# Patient Record
Sex: Female | Born: 1935 | Race: White | Hispanic: No | Smoking: Never smoker
Health system: Southern US, Community
[De-identification: ages and names within clinical notes are randomized; demographics above are authoritative.]

## PROBLEM LIST (undated history)

## (undated) DIAGNOSIS — F419 Anxiety disorder, unspecified: Secondary | ICD-10-CM

## (undated) DIAGNOSIS — E119 Type 2 diabetes mellitus without complications: Secondary | ICD-10-CM

## (undated) DIAGNOSIS — F0280 Dementia in other diseases classified elsewhere without behavioral disturbance: Secondary | ICD-10-CM

## (undated) DIAGNOSIS — G309 Alzheimer's disease, unspecified: Secondary | ICD-10-CM

## (undated) DIAGNOSIS — I639 Cerebral infarction, unspecified: Secondary | ICD-10-CM

## (undated) DIAGNOSIS — I10 Essential (primary) hypertension: Secondary | ICD-10-CM

## (undated) DIAGNOSIS — K219 Gastro-esophageal reflux disease without esophagitis: Secondary | ICD-10-CM

## (undated) DIAGNOSIS — R42 Dizziness and giddiness: Secondary | ICD-10-CM

## (undated) HISTORY — DX: Type 2 diabetes mellitus without complications: E11.9

## (undated) HISTORY — DX: Gastro-esophageal reflux disease without esophagitis: K21.9

## (undated) HISTORY — DX: Cerebral infarction, unspecified: I63.9

## (undated) HISTORY — DX: Essential (primary) hypertension: I10

## (undated) HISTORY — PX: ABDOMINAL HYSTERECTOMY: SHX81

## (undated) HISTORY — DX: Dizziness and giddiness: R42

---

## 2007-03-28 ENCOUNTER — Ambulatory Visit: Payer: Self-pay | Admitting: Gastroenterology

## 2007-04-24 ENCOUNTER — Ambulatory Visit: Payer: Self-pay | Admitting: Gastroenterology

## 2007-05-08 ENCOUNTER — Ambulatory Visit: Payer: Self-pay | Admitting: Gastroenterology

## 2007-05-08 ENCOUNTER — Encounter: Payer: Self-pay | Admitting: Gastroenterology

## 2007-12-29 ENCOUNTER — Inpatient Hospital Stay (HOSPITAL_COMMUNITY): Admission: EM | Admit: 2007-12-29 | Discharge: 2008-01-01 | Payer: Self-pay | Admitting: Emergency Medicine

## 2007-12-29 ENCOUNTER — Ambulatory Visit: Payer: Self-pay | Admitting: Cardiology

## 2007-12-31 ENCOUNTER — Encounter (INDEPENDENT_AMBULATORY_CARE_PROVIDER_SITE_OTHER): Payer: Self-pay | Admitting: Emergency Medicine

## 2008-01-15 ENCOUNTER — Ambulatory Visit: Payer: Self-pay | Admitting: Cardiology

## 2008-12-15 ENCOUNTER — Inpatient Hospital Stay (HOSPITAL_COMMUNITY): Admission: EM | Admit: 2008-12-15 | Discharge: 2008-12-17 | Payer: Self-pay | Admitting: Emergency Medicine

## 2008-12-15 ENCOUNTER — Ambulatory Visit: Payer: Self-pay | Admitting: Cardiovascular Disease

## 2008-12-16 ENCOUNTER — Encounter (INDEPENDENT_AMBULATORY_CARE_PROVIDER_SITE_OTHER): Payer: Self-pay | Admitting: Neurology

## 2008-12-17 ENCOUNTER — Encounter (INDEPENDENT_AMBULATORY_CARE_PROVIDER_SITE_OTHER): Payer: Self-pay | Admitting: Neurology

## 2009-11-11 ENCOUNTER — Inpatient Hospital Stay (HOSPITAL_COMMUNITY): Admission: EM | Admit: 2009-11-11 | Discharge: 2009-11-14 | Payer: Self-pay | Admitting: Emergency Medicine

## 2011-02-28 LAB — RETICULOCYTES
RBC.: 3.76 MIL/uL — ABNORMAL LOW (ref 3.87–5.11)
Retic Count, Absolute: 37.6 10*3/uL (ref 19.0–186.0)

## 2011-02-28 LAB — CBC
HCT: 34.5 % — ABNORMAL LOW (ref 36.0–46.0)
HCT: 34.7 % — ABNORMAL LOW (ref 36.0–46.0)
Hemoglobin: 11.3 g/dL — ABNORMAL LOW (ref 12.0–15.0)
MCHC: 32.8 g/dL (ref 30.0–36.0)
Platelets: 180 10*3/uL (ref 150–400)
Platelets: 181 10*3/uL (ref 150–400)
RBC: 3.68 MIL/uL — ABNORMAL LOW (ref 3.87–5.11)
RDW: 14.8 % (ref 11.5–15.5)
RDW: 14.9 % (ref 11.5–15.5)
RDW: 15 % (ref 11.5–15.5)

## 2011-02-28 LAB — BRAIN NATRIURETIC PEPTIDE: Pro B Natriuretic peptide (BNP): 561 pg/mL — ABNORMAL HIGH (ref 0.0–100.0)

## 2011-02-28 LAB — COMPREHENSIVE METABOLIC PANEL
ALT: 11 U/L (ref 0–35)
Albumin: 2.7 g/dL — ABNORMAL LOW (ref 3.5–5.2)
Alkaline Phosphatase: 44 U/L (ref 39–117)
Chloride: 108 mEq/L (ref 96–112)
Potassium: 3.3 mEq/L — ABNORMAL LOW (ref 3.5–5.1)
Sodium: 139 mEq/L (ref 135–145)
Total Bilirubin: 0.4 mg/dL (ref 0.3–1.2)
Total Protein: 6 g/dL (ref 6.0–8.3)

## 2011-02-28 LAB — BASIC METABOLIC PANEL
BUN: 21 mg/dL (ref 6–23)
BUN: 22 mg/dL (ref 6–23)
Calcium: 8.7 mg/dL (ref 8.4–10.5)
Creatinine, Ser: 1.52 mg/dL — ABNORMAL HIGH (ref 0.4–1.2)
GFR calc non Af Amer: 26 mL/min — ABNORMAL LOW (ref >60)
GFR calc non Af Amer: 32 mL/min — ABNORMAL LOW (ref >60)
Glucose, Bld: 113 mg/dL — ABNORMAL HIGH (ref 70–99)
Glucose, Bld: 98 mg/dL (ref 70–99)
Potassium: 3.4 mEq/L — ABNORMAL LOW (ref 3.5–5.1)

## 2011-02-28 LAB — GLUCOSE, CAPILLARY
Glucose-Capillary: 103 mg/dL — ABNORMAL HIGH (ref 70–99)
Glucose-Capillary: 108 mg/dL — ABNORMAL HIGH (ref 70–99)
Glucose-Capillary: 121 mg/dL — ABNORMAL HIGH (ref 70–99)
Glucose-Capillary: 136 mg/dL — ABNORMAL HIGH (ref 70–99)
Glucose-Capillary: 145 mg/dL — ABNORMAL HIGH (ref 70–99)
Glucose-Capillary: 98 mg/dL (ref 70–99)

## 2011-02-28 LAB — RPR: RPR Ser Ql: NONREACTIVE

## 2011-02-28 LAB — BLOOD GAS, ARTERIAL
Acid-Base Excess: 1.1 mmol/L (ref 0.0–2.0)
FIO2: 0.21 %
O2 Saturation: 97.5 %
pCO2 arterial: 32 mmHg — ABNORMAL LOW (ref 35.0–45.0)

## 2011-02-28 LAB — LIPID PANEL
Cholesterol: 141 mg/dL (ref 0–200)
HDL: 26 mg/dL — ABNORMAL LOW (ref >39)
LDL Cholesterol: 98 mg/dL (ref 0–99)
Total CHOL/HDL Ratio: 5.4 RATIO

## 2011-02-28 LAB — DIFFERENTIAL
Basophils Absolute: 0 10*3/uL (ref 0.0–0.1)
Lymphocytes Relative: 33 % (ref 12–46)
Neutro Abs: 4.6 10*3/uL (ref 1.7–7.7)
Neutrophils Relative %: 55 % (ref 43–77)

## 2011-02-28 LAB — CK TOTAL AND CKMB (NOT AT ARMC)
CK, MB: 1.7 ng/mL (ref 0.3–4.0)
Total CK: 31 U/L (ref 7–177)

## 2011-02-28 LAB — IRON AND TIBC
Iron: 40 ug/dL — ABNORMAL LOW (ref 42–135)
TIBC: 230 ug/dL — ABNORMAL LOW (ref 250–470)

## 2011-02-28 LAB — AMMONIA: Ammonia: 24 umol/L (ref 11–35)

## 2011-02-28 LAB — VITAMIN B12: Vitamin B-12: 710 pg/mL (ref 211–911)

## 2011-03-01 LAB — DIFFERENTIAL
Basophils Relative: 0 % (ref 0–1)
Eosinophils Absolute: 0.1 10*3/uL (ref 0.0–0.7)
Lymphs Abs: 1.3 10*3/uL (ref 0.7–4.0)
Monocytes Absolute: 0.6 10*3/uL (ref 0.1–1.0)
Monocytes Relative: 6 % (ref 3–12)
Neutrophils Relative %: 78 % — ABNORMAL HIGH (ref 43–77)

## 2011-03-01 LAB — URINALYSIS, ROUTINE W REFLEX MICROSCOPIC
Hgb urine dipstick: NEGATIVE
Leukocytes, UA: NEGATIVE
Nitrite: NEGATIVE
Protein, ur: 30 mg/dL — AB
Urobilinogen, UA: 0.2 mg/dL (ref 0.0–1.0)

## 2011-03-01 LAB — CBC
Hemoglobin: 12.3 g/dL (ref 12.0–15.0)
MCHC: 32.6 g/dL (ref 30.0–36.0)
Platelets: 193 10*3/uL (ref 150–400)
RDW: 14.8 % (ref 11.5–15.5)

## 2011-03-01 LAB — COMPREHENSIVE METABOLIC PANEL
ALT: 13 U/L (ref 0–35)
AST: 20 U/L (ref 0–37)
Albumin: 3.1 g/dL — ABNORMAL LOW (ref 3.5–5.2)
Alkaline Phosphatase: 59 U/L (ref 39–117)
Calcium: 9.1 mg/dL (ref 8.4–10.5)
GFR calc Af Amer: 33 mL/min — ABNORMAL LOW (ref >60)
Glucose, Bld: 188 mg/dL — ABNORMAL HIGH (ref 70–99)
Potassium: 4.8 mEq/L (ref 3.5–5.1)
Sodium: 136 mEq/L (ref 135–145)
Total Protein: 6.7 g/dL (ref 6.0–8.3)

## 2011-03-01 LAB — POCT CARDIAC MARKERS
CKMB, poc: <1 ng/mL — ABNORMAL LOW (ref 1.0–8.0)
Troponin i, poc: <0.05 ng/mL (ref 0.00–0.09)

## 2011-03-01 LAB — HEMOGLOBIN A1C: Hgb A1c MFr Bld: 6.1 % (ref 4.6–6.1)

## 2011-03-01 LAB — URINE MICROSCOPIC-ADD ON

## 2011-03-01 LAB — CK TOTAL AND CKMB (NOT AT ARMC)
CK, MB: 0.8 ng/mL (ref 0.3–4.0)
Relative Index: INVALID (ref 0.0–2.5)
Total CK: 30 U/L (ref 7–177)

## 2011-03-01 LAB — GLUCOSE, CAPILLARY: Glucose-Capillary: 157 mg/dL — ABNORMAL HIGH (ref 70–99)

## 2011-03-01 LAB — T4, FREE: Free T4: 1.23 ng/dL (ref 0.80–1.80)

## 2011-03-14 LAB — COMPREHENSIVE METABOLIC PANEL
AST: 29 U/L (ref 0–37)
Albumin: 3.2 g/dL — ABNORMAL LOW (ref 3.5–5.2)
Alkaline Phosphatase: 53 U/L (ref 39–117)
BUN: 16 mg/dL (ref 6–23)
CO2: 26 mEq/L (ref 19–32)
Calcium: 9.4 mg/dL (ref 8.4–10.5)
Chloride: 100 mEq/L (ref 96–112)
Chloride: 99 mEq/L (ref 96–112)
Creatinine, Ser: 1.01 mg/dL (ref 0.4–1.2)
Creatinine, Ser: 1.16 mg/dL (ref 0.4–1.2)
GFR calc Af Amer: 53 mL/min — ABNORMAL LOW (ref >60)
GFR calc non Af Amer: 52 mL/min — ABNORMAL LOW (ref >60)
Glucose, Bld: 118 mg/dL — ABNORMAL HIGH (ref 70–99)
Potassium: 3.9 mEq/L (ref 3.5–5.1)
Total Bilirubin: 0.6 mg/dL (ref 0.3–1.2)
Total Bilirubin: 0.7 mg/dL (ref 0.3–1.2)
Total Protein: 7.2 g/dL (ref 6.0–8.3)

## 2011-03-14 LAB — GLUCOSE, CAPILLARY
Comment 2: 161631
Glucose-Capillary: 106 mg/dL — ABNORMAL HIGH (ref 70–99)
Glucose-Capillary: 109 mg/dL — ABNORMAL HIGH (ref 70–99)
Glucose-Capillary: 63 mg/dL — ABNORMAL LOW (ref 70–99)
Glucose-Capillary: 71 mg/dL (ref 70–99)

## 2011-03-14 LAB — LIPID PANEL
Cholesterol: 139 mg/dL (ref 0–200)
LDL Cholesterol: 85 mg/dL (ref 0–99)
Total CHOL/HDL Ratio: 4.1 RATIO
Triglycerides: 100 mg/dL (ref <150)
VLDL: 20 mg/dL (ref 0–40)

## 2011-03-14 LAB — DIFFERENTIAL
Basophils Absolute: 0.1 10*3/uL (ref 0.0–0.1)
Eosinophils Relative: 1 % (ref 0–5)
Lymphocytes Relative: 30 % (ref 12–46)
Monocytes Absolute: 1.1 10*3/uL — ABNORMAL HIGH (ref 0.1–1.0)
Monocytes Relative: 10 % (ref 3–12)

## 2011-03-14 LAB — CBC
HCT: 36.5 % (ref 36.0–46.0)
HCT: 38 % (ref 36.0–46.0)
Hemoglobin: 11.8 g/dL — ABNORMAL LOW (ref 12.0–15.0)
Hemoglobin: 12.8 g/dL (ref 12.0–15.0)
MCHC: 32.4 g/dL (ref 30.0–36.0)
MCV: 92.1 fL (ref 78.0–100.0)
RBC: 3.96 MIL/uL (ref 3.87–5.11)
WBC: 10.9 10*3/uL — ABNORMAL HIGH (ref 4.0–10.5)
WBC: 11.1 10*3/uL — ABNORMAL HIGH (ref 4.0–10.5)

## 2011-03-14 LAB — APTT: aPTT: 21 seconds — ABNORMAL LOW (ref 24–37)

## 2011-03-14 LAB — URINALYSIS, ROUTINE W REFLEX MICROSCOPIC
Bilirubin Urine: NEGATIVE
Glucose, UA: NEGATIVE mg/dL
Hgb urine dipstick: NEGATIVE
Specific Gravity, Urine: 1.022 (ref 1.005–1.030)
Urobilinogen, UA: 0.2 mg/dL (ref 0.0–1.0)

## 2011-03-14 LAB — URINE CULTURE

## 2011-03-14 LAB — URINE MICROSCOPIC-ADD ON

## 2011-03-14 LAB — TROPONIN I: Troponin I: 0.02 ng/mL (ref 0.00–0.06)

## 2011-03-14 LAB — HEMOGLOBIN A1C: Hgb A1c MFr Bld: 6.3 % — ABNORMAL HIGH (ref 4.6–6.1)

## 2011-03-14 LAB — CARDIAC PANEL(CRET KIN+CKTOT+MB+TROPI): Relative Index: INVALID (ref 0.0–2.5)

## 2011-03-14 LAB — CK TOTAL AND CKMB (NOT AT ARMC): CK, MB: 1.1 ng/mL (ref 0.3–4.0)

## 2011-04-12 NOTE — H&P (Signed)
[name], [name]             ACCOUNT NO.:  [id number]   MEDICAL RECORD NO.:  [id number]          PATIENT TYPE:  EMS   LOCATION:  MAJO                         FACILITY:  [facility]   PHYSICIAN:  Pramod P. Pearlean Brownie, MD    DATE OF BIRTH:  01/24/1917   DATE OF ADMISSION:  12/15/2008  DATE OF DISCHARGE:                              HISTORY & PHYSICAL   REFERRING PHYSICIAN:  Eber Hong, MD.   REASON FOR REFERRAL:  Code stroke.   HISTORY OF PRESENT ILLNESS:  [name] is an 77 year old Caucasian lady  who was last seen normal at about 11:50 when she spoke to a family  member.  After that when the family returned, she was found to be having  trouble with speaking and not making sense when she spoke and symptoms  have not improved since arrival in the emergency room.  There was no  documented seizure activity or focal extremity weakness noted.  CT scan  of the head was performed, which showed no acute abnormality.  A code  stroke was called at 15:45 and last seen normal was reported at 11:30,  but the family members stated the last seen normal was 11:50.  I saw the  patient within 15 minutes of arrival and it was clear that she had  significant expressive and receptive aphasia without hemiparesis.  I met  with multiple family members and explained that the patient did not meet  criteria for IV thrombolysis because the time of onset was beyond 3  hours and her age was beyond 77 and she had a previous history of  subdural hematoma in February 2009, but she may qualify for catheter-  based intervention or participation in the __________stroke trial as the  time of onset was within 4-5 hours, and I spoke over the phone with the  patient's son who agreed and Intervention Radiology were notified to  consider catheter-based intervention.   PAST MEDICAL HISTORY:  Significant for:  1. Arthritis.  2. Coronary artery disease.  3. Diabetes.  4. Hypertension.  5. Subdural hematoma in February  2009.  6. Syncopal episodes.  7. Anemia.  8. Diverticulosis.  9. Gastroesophageal reflux disease.   HOME MEDICATIONS:  Toprol, aspirin, omeprazole, Hyzaar, glipizide, and,  Janumet.   MEDICATION ALLERGIES:  None.   SOCIAL HISTORY:  The patient lives in [place].  She is independent in  actives of daily living.  She does not smoke or drink.   PHYSICAL EXAMINATION:  GENERAL:  A frail elderly Caucasian lady who is  at present not in distress.  VITAL SIGNS:  She is afebrile; pulse rate is 63 per minute, regular  sinus; respiratory rate 20 per minute, blood pressure 173/60, and sats  100%.  HEAD:  Nontraumatic.  ENT:  Unremarkable.  NECK:  Supple.  There is no bruit.  CARDIAC:  No murmur or gallop.  LUNGS:  Clear to auscultation.  NEUROLOGIC:  The patient is awake and alert.  She has global aphasia.  She does not follow commands.  She does not answer her age or month.  Her speech is mostly nonsensical, but occasionally  she speaks clear  sentences and says I am okay, I need help.  She has minimum right  lower facial asymmetry.  She does not blink to threat consistently on  the right compared to the left.  There is no upper extremity drift.  She  has no lower extremity drift.  She has intact touch to pinprick  sensation.  She did have little drift in both legs to the nurse, but not  to me.  Her NIH stroke scale is 11, modified Rankin is 1.   CT scan of the head reveals extensive changes of small vessel disease  and mild generalized atrophy and dilatation of ventricles.  No acute  abnormalities noted.  Admission labs are pending at this time.   IMPRESSION:  An 77-year lady with sudden onset of expressive and  receptive aphasia onset around 11:50 today due to a large left middle  cerebral artery infarct.  The patient has presented within 4-5 hours of  onset of symptoms, but does not qualify for IV thrombolysis due to  previous history of brain hemorrhage and time of onset beyond 3  hours.  The patient is at significant risk for neurological worsening from a  stroke.  I have had a long discussion to the patient's son and multiple  family members and explained that she may qualify for catheter-based  intervention or participation stroke trial.  The family wants to be  aggressive.  I have discussed with them the 10% risk of intracerebral  hemorrhage with catheter-based intervention and they agree, so  Interventional Neuroradiology has been consulted and the patient will go  to radiology lab for diagnostic angiogram and further treatment if  indicated.  The patient is critically ill at significant risk for  worsening.  Her care requires complex decision-making.  I spent 1 hour  of critical care time with the patient face-to-face as well as ordering  test and following up on the tests and with multiple visits with family  members and discussion with radiologist.  She will be admitted to the  intensive care unit to the stroke service.  We will order further  appropriate stroke workup including MRI scan, echocardiogram, Doppler  studies, and labs and keep tight control on her blood pressure.           ______________________________  Catherine Shy P. Pearlean Brownie, MD     PPS/MEDQ  D:  12/15/2008  T:  12/16/2008  Job:  [id number]

## 2011-04-12 NOTE — Discharge Summary (Signed)
NAMECRICKET, GOODLIN             ACCOUNT NO.:  [id number]   MEDICAL RECORD NO.:  [id number]          PATIENT TYPE:  INP   LOCATION:  3005                         FACILITY:  [facility]   PHYSICIAN:  Pramod P. Pearlean Brownie, MD    DATE OF BIRTH:  1917/04/27   DATE OF ADMISSION:  12/15/2008  DATE OF DISCHARGE:  12/17/2008                               DISCHARGE SUMMARY   PRIMARY CARE PHYSICIAN:  Madaline Savage, MD   DISCHARGE DIAGNOSES:  1. 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  2. History of subdural hematoma after a fall in February 2009.  3. Coronary artery disease with myocardial infarction 5 years ago with      4 stents placed.  4. History of vasovagal syncope.  5. Hypertension.  6. Diabetes.  7. Severe rheumatoid arthritis.  8. Anemia.  9. Gastroesophageal reflux disease.  10.Urinary tract infection on admission to the hospital.   DISCHARGE MEDICINES:  1. Niacin 1000 mg b.i.d.  2. Hyzaar 50/12.5 mg a day.  3. Janumet 50/500 mg 1 tablet in the morning, 1-1/2 in the evening.  4. Glipizide 10 mg one-half tablet twice a day.  5. Metoprolol 50 mg 1 a day.  6. Hydrocodone q.4-6 h. p.r.n. for arthritis pain.  7. Vitamin D 2000 international units a day.  8. Aspirin 325 mg a day.  9. Prilosec over the counter 1 tablet a day.  10.Antacid and calcium supplement p.r.n. heartburn.  11.Multiple nutritional supplement per Dr. Corine Shelter including liquid      multivitamin, calcium chewable, vitamin D, Prolent, Lentra, SAMe,      marine lipids, DATA, Serrapeptase, NAC, vitamin C 500 mg a day.   STUDIES PERFORMED:  1. CT of the brain on admission shows no acute abnormality.  Chronic      ischemic changes, resolved left extra-axial hemorrhage.  2. MRA of the brain shows small right parietal and left mid cerebellar      infarct, atrophy, moderate small vessel disease-type changes.  3. Cerebral angiogram, performed by Dr. Kerby Nora, shows no  growth, occlusion, stenosis, dissections, or filling deficits      noted, intra or extracranially.  4. Carotid Doppler not performed.  5. A 2-D echocardiogram performed, results pending.  6. Transcranial Doppler not performed.  7. EKG shows normal sinus rhythm with left ventricular hypertrophy and      repolarization abnormality.   LABORATORY STUDIES:  Homocystine 16.8.  Hemoglobin A1c 6.3.  Urinalysis  with 21-50 white blood cells, 0-2 red blood cells, a few bacteria.  Urine cultures pending.  Cholesterol 139, triglycerides 100, HDL 34, LDL  85.  Cardiac enzymes negative.  Chemistries with sodium 134, glucose  118, GFR 52.  Coagulation studies on admission were normal except for  PTT, which was 21 and CBC with hemoglobin 11.8, white blood cells 10.9,  platelets 224.  Liver function tests normal, albumin 3.2.   HOSPITAL COURSE:  [name] is an 77 year old right-handed Caucasian  female, who was last seen normal at 11:50 when she spoke to a family  member on the phone.  After that when her family returned, she was found  to be having trouble speaking and not making sense when she spoke, and  symptoms have not improved since arrival to the emergency room.  There  was no documented seizure activity or focal extremity weakness.  CT of  the head was performed, which showed no acute abnormality.  The patient  was beyond the 3-hour tPA window.  She did not meet the 3- to 4-1/2 hour  tPA window as her age was greater than 80.  She was felt to possibly  have an embolic infarct and was sent to angiogram for further  evaluation.  She was found to have no obvious stenosis or clot in the  angiogram, and the patient was admitted for further evaluation with no  intervention performed.   HOSPITAL COURSE:  MRI did reveal acute embolic infarcts, one in the  right parietal and one in the left mid cerebellar region.  It was felt  to be embolic though source is not found.  The patient has a history of   subdural hemorrhage after a fall 1 year ago, so therefore is not a  Coumadin candidate.  Because she is not a Coumadin candidate, great  lengths were not taken to further describe an embolic source as she was  not a candidate for treatment.  She was on aspirin prior to admission,  and the family does not want her to be on Plavix due to some unknown  reaction to Plavix in the past.  We will keep the patient on aspirin 325  mg a day with ongoing risk factor control for secondary stroke  prevention.  There was some concern she may have had a seizure caused  the symptoms, but EEG was negative.  Of note, she did have a urinary  tract infection, which was treated with antibiotics in the hospital and  will continue her course on discharge.  She was evaluated by PT and OT  and felt she would benefit from home health PT and OT at discharge.  They have recommended 24-hour supervision at home, and this has been  shared with family.  She is now safe to discharge home and arrangements  have been made.   CONDITION AT DISCHARGE:  The patient is alert and oriented x3.  She has  occasional word finding difficulties.  No dysarthria.  She moves all 4  extremities without difficulty, use assistance of walker to help her  ambulate and has minimal left facial weakness.   DISCHARGE/PLAN:  1. Discharge home with family.  2. Home health PT and OT.  3. Aspirin for secondary stroke prevention.  4. Ongoing risk factor control by primary care physician.  5. Follow up with primary care physician within 1 month.  6. Follow up with Dr. Delia Heady in 2-3 months.      Annie Main, N.P.    ______________________________  Sunny Schlein. Pearlean Brownie, MD    SB/MEDQ  D:  12/17/2008  T:  12/18/2008  Job:  [id number]   cc:   Salley Scarlet. Russella Dar, MD, [facility]  Madaline Savage, MD  Jesse Sans. Wall, MD, [facility]

## 2011-04-12 NOTE — H&P (Signed)
[name], [name]             ACCOUNT NO.:  [id number]   MEDICAL RECORD NO.:  [id number]          PATIENT TYPE:  EMS   LOCATION:  MAJO                         FACILITY:  [facility]   PHYSICIAN:  Madaline Savage, MD        DATE OF BIRTH:  09-18-1917   DATE OF ADMISSION:  12/29/2007  DATE OF DISCHARGE:                              HISTORY & PHYSICAL   PRIMARY CARE PHYSICIAN:  None.  This patient sees Dr. Redmond School, who does  home visits on her.   PRIMARY CARE PHYSICIAN:  With [facility].   CHIEF COMPLAINT:  Syncope.   HISTORY OF PRESENT ILLNESS:  Ms. Glatfelter is an 77 year old lady with a  history of coronary artery disease, status post MI 4 years ago, who  comes after a syncopal episode.  She was with her granddaughter in a  restaurant and she had her lunch, and she had got up and walked to the  coat.  She remembers taking her coat off and she does not remember  anything further.  She apparent fell face down and hit her chin.  She  was apparently unconsciousness for approximately 5 minutes.  She denies  having any warning signs for her passing out.  She denies any aura.  She  denies any dizziness, lightheadedness, any headaches, or any chest pain  or shortness of breath.  When she came around she did have some nausea,  but she was not found to be confused.  Since she had bruises on her face  and there was bleeding from her face when she woke up.  At this time her  bleeding has stopped.  She denies any chest pain, shortness of breath,  abdominal pain, nausea, vomiting, diarrhea, or  constipation.  She  denies having any fever or chills.  She denies having any weakness at  this time.  She does tell me that 4 years ago when she had an MI she did  present with syncopal episode at that time.   PAST MEDICAL HISTORY:  1. Coronary artery disease, status post MI 4 years ago.  She had 4      stents placed at that time.  2. Diabetes mellitus.  3. Hypertension.  4. Rheumatoid arthritis.  She  states she was on methotrexate in the      past, but she has now been taken off it.   ALLERGIES:  SHE IS ALLERGIC TO PENICILLIN WHICH CAUSES HIVES.   CURRENT MEDICATIONS:  1. She is on aspirin 325 mg daily.  2. Omeprazole 20 mg daily.  3. Vicodin 1/2 tablet as needed.  4. Glipizide 5 mg twice daily.  5. Metoprolol Extended Release 50 mg daily.  6. Janumet 50/500, 1 tablet in the morning and half tablet at night.  7. Hyzaar 50/12.5 twice daily.   SOCIAL HISTORY:  She lives at home.  She denies any history of smoking,  alcohol, or drug abuse.   FAMILY HISTORY:  Noncontributory.   REVIEW OF SYSTEMS:  GENERAL:  She denies recent weight loss or weight  gain.  No fever or chills.  HEENT:  No  headaches, no blurred vision, no  sore throat.  CARDIOVASCULAR:  Denies chest pain, palpitations.  RESPIRATORY:  No cough.  GIT:  No abdominal pain, nausea, vomiting,  diarrhea, or constipation.   PHYSICAL EXAMINATION:  She is alert and oriented x3.  VITAL SIGNS:  Temperature 97.5.  Pulse rate of 69 per minute.  Blood  pressure 199/73.  Respiratory rate 16 per minute.  Oxygen saturation 98%  on 2 liters.  HEENT:  Normocephalic, normocephalic.  Pupils bilaterally equal and  react to light.  NECK:  Supple.  She has bruits over her chin and her lips.  CARDIOVASCULAR:  S1 and S2 heard.  Regular rate and rhythm.  CHEST:  Clear to auscultation.  ABDOMEN:  Soft.  Bowel sounds heard.  EXTREMITIES:  No edema, cyanosis, or clubbing.  CENTRAL NERVOUS SYSTEM:  No focal neurological deficits.  Power equal in  all 4 extremities.  Reflexes equal.   LABS:  Show troponin less than 0.05, CK MB 1.2, white count of 11.3,  hemoglobin 11.3, platelets 234.  Sodium 134, potassium 4.5, BUN 13,  creatinine 1.09, glucose 146.  Urinalysis is negative.  EKG shows some  nonspecific ST-T wave changes.  An x-ray of the chest, which shows tiny  left lung effusion with mild bibasilar atelectasis.  Low lung volume.  She had a  CT of the head and maxillofacial which showed no acute  findings in her head.  It did show a small left parietal tumor of 1.4  cm.   IMPRESSION:  1. Syncope.  Rule out cardiogenic syncope.  2. History of coronary artery disease.  3. Left parietomeningioma.  4. Diabetes mellitus.  5. Hypertension.  6. Leukocytosis, likely reactive.   PLAN:  1. Syncope.  This is an 77 year old lady who comes in with syncope.      This could likely be a cardiogenic syncope from the history.  Her      last myocardial infarction 4 years ago presented as syncope.  She      passed out without any warning signs.  At this time her EKG does      not show any acute changes.  Her troponins are negative.  I will      admit her to a monitor floor and watch her for any arrhythmias.  I      will also get a 2D echocardiogram on her and cycle cardiac makers.  2. Left parietomeningioma.  This is likely an incidental finding.  I      do not think her symptoms are related to her meningioma, and since      this has not caused any symptoms, I would recommend a follow up be      made in 3-6 months.  At this time I will also get an MRI of her      brain to better characterize her meningioma.  3. History of coronary artery disease.  I will continue her on her      aspirin and beta blocker at this time.  We will be cycling cardiac      markers.  4. Leukocytosis.  This is likely reactive secondary to her stress.      Will watch this.  5. Diabetes mellitus.  I will continue her home medications.  I will      also put her on a sliding scale.  6. Hypertension.  Her blood pressure is markedly elevated.  This could      be secondary to her fall  and pain.  I will restart her home      medications and adjust them as necessary.  7. I will put her on Lovenox for DVT prophylaxis.      Madaline Savage, MD  Electronically Signed     PKN/MEDQ  D:  12/29/2007  T:  12/29/2007  Job:  [id number]

## 2011-04-12 NOTE — Assessment & Plan Note (Signed)
[facility]                            CARDIOLOGY OFFICE NOTE   NAME:GEIGERRaechal, [name]                      MRN:          [id number]  DATE:01/15/2008                            DOB:          12/30/1916    [name] returns today.  We saw her in hospital on December 30, 2007 in  consultation.  She presented with vasovagal syncope while eating at  [employer].   She ruled out for myocardial infarction.  She had a left parietal  subdural hematoma which stabilized.  She did not require surgery.  She  has a history of previous coronary disease and has had myocardial  infarction about 4 years ago in [place], [place].  She had four  stents placed at that time.  Apparently she had a negative stress test  about a year ago.  We did not have any records.   She has a history of hypertension, diabetes, severe crippling rheumatoid  arthritis, anemia, gastroesophageal reflux.   Since discharge she has been living with her family.  She has her own  little apartment downstairs and has a chariot that carries her  upstairs for dinner.  She is white and right cued and independent.  She  really would like to drive but understands that she cannot right now.   Her daughter and her refused a statin in the hospital.  Her LDL was  actually well below 100.   She is currently on Toprol 50 mg a Hyzaar 50/12.5 daily, aspirin 325 mg  a day, glipizide 10 mg a day, omeprazole 20 mg q. a.m. Janumet 50/500  one q. a.m. half q. p.m.   She looks remarkably good today.  She has residual bruising over her left mandibular and submaxillary  level and also around her temples.  Her facial symmetry is normal.  PERLA.  Extraocular is intact.  Sclerae are clear.  Face symmetry is  normal.  She is alert and oriented.  She is quite Occupational hygienist.  Her blood pressure 158/72 daughter says is 118-120 this morning, pulse  77 and regular.  EKG shows sinus rhythm with some mild LVH but no  acute  changes.  Carotids were equal bilateral without bruits, no JVD.  Thyroid is not  enlarged.  Trachea is midline.  LUNGS:  Clear.  HEART:  Reveals a nondisplaced PMI.  Normal S1-S2.  ABDOMEN:  Soft.  EXTREMITIES:  Reveal no significant edema.  Pulses are present.  NEURO:  Exam is grossly intact.   I am delighted [name] is doing so well.  Will continue with medical  conservative cardiovascular care.  I have made no change in her medical  program.  Will see her back again in 6 months.  She is doing well at  that time we may release her to drive.     Thomas C. Daleen Squibb, MD, [facility]  Electronically Signed    TCW/MedQ  DD: 01/15/2008  DT: 01/16/2008  Job #: [id number]

## 2011-04-12 NOTE — Procedures (Signed)
EEG NUMBER:  [id number]   This patient undergoes an EEG upon orders of Dr. Delia Heady.   The study is performed as a portable study in an awake and drowsy  patient who is described as right-handed individual.  This female is 77  years of age and admitted after left brain stroke.  She has an altered  mental status, seizures have to be ruled out.  Current 16-channel EEG recording did not include hyperventilation, but  photic stimulation was performed. One channel exclusivly records heart  rate and rhythm.   The patient is currently under the following medications aspirin,  Lovenox, Lopressor, Cozaar, hydrochlorothiazide, glipizide, Protonix,  Januvia, and Glucophage.   DESCRIPTION:  A posterior dominant rhythm is identified at 7 Hz and emit  symmetrically from the brain hemispheres.  EKG shows normal sinus  rhythm.  There is no excessive movement noticed and the patient seems to  fall asleep and in and out of sleep during this recording, which further  shows vertex sharp waves, K complexes, and sleep spindles.    Finally, the technician annoted that the patient was actually snoring  for part of the EEG which confirms the sleep EEG architecture .  Photic stimulation was performed towards the end of the recording and  frequencies from 3 Hz on showed photic entrainment.  This includes the  frequencies at 5 Hz, 7 Hz, and 9 Hz.  Higher frequencies do not lead to  the same response anymore and the patient fell immediately asleep after  photic stimulation.  The technician noted that once the patient was aroused once again the  posterior dominant rhythm resumed at 7 Hz in the left posterior  hemisphere, but after photic stimulation, the right posterior dominant  rhythm is slightly quicker at 8 Hz and its wave and amlitude are better  formed.  There is slowing seen over the T3, T5, and O1 electrodes that  represent the left temporal brain.  This lasted only for 2 minutes ,  than posterior  rhythms become symmetric again.  The patient is recorded while in a presumed alert state.   CONCLUSION:  This is an abnormal EEG because of asymmetry that was  noticed after the patient was awoken, slowing and lower amplitudes are  noticed in the T3, T5, and O1 representing channels.  Epileptiform discharges are not noted.      Melvyn Novas, M.D.  Electronically Signed     HY:QMVH  D:  12/16/2008 19:44:17  T:  12/17/2008 02:55:40  Job #:  [id number]   cc:   Pramod P. Pearlean Brownie, MD  Fax: [phone]

## 2011-04-12 NOTE — Discharge Summary (Signed)
[name], [name]             ACCOUNT NO.:  [id number]   MEDICAL RECORD NO.:  [id number]          PATIENT TYPE:  INP   LOCATION:  3701                         FACILITY:  [facility]   PHYSICIAN:  Madaline Savage, MD        DATE OF BIRTH:  1917/05/04   DATE OF ADMISSION:  12/29/2007  DATE OF DISCHARGE:  01/01/2008                               DISCHARGE SUMMARY   PRIMARY CARE PHYSICIAN:  Dr. Redmond School.   CONSULTATIONS IN THE HOSPITAL:  She was seen by Dr. Jeral Fruit from  neurosurgery and Dr. Daleen Squibb from cardiology.   DISCHARGE DIAGNOSES:  1. Left parietal subdural hematoma, which is stable.  2. Status post syncope, likely vasovagal.  3. History of coronary artery disease.  4. Diabetes mellitus.  5. Rheumatoid arthritis.  6. Hypertension.   DISCHARGE MEDICATIONS:  1. Omeprazole 20 mg daily.  2. Vicodin 1/2 tablet as needed.  3. Glipizide 5 mg twice daily.  4. Metoprolol 50 mg daily.  5. Janumet 50/100 1 tablet in the morning and 1/2 tablet at night.  6. Hyzaar 50/12.5 mg twice daily.  7. She is asked to stop her aspirin now and restart aspirin after one      week at 325 mg daily.   HISTORY OF PRESENT ILLNESS:  For a full history and physical, see the  history and physical dictated by Dr. Darene Lamer on December 29, 2007.  In  short, [name] is an 77 year old lady who apparently passed out after  having dinner at [facility].  She fell and hit her face.  Her initial CT  scan showed a questionable density in her brain, and she was admitted  for further evaluation and management.   PROBLEM LIST:  1. Left parietal subdural hematoma:  [name] had a fall, and she      was brought to the hospital.  Initial CT scan, whose preliminary      report read it as questionable meningioma.  She was admitted, and      an MRI was done which showed a subdural hematoma in the left      parietal region.  We consulted neurosurgery, and Dr. Jeral Fruit saw      this patient.  She has neurologically been stable during the  course      of the hospital stay.  She had a follow-up CT scan done on December 31, 2007 which showed stable-to-decreased-appearing size of the      subdural hematoma.  At this time, she has been cleared by      neurosurgery to be discharged home.  2. Syncope:  She had a sudden onset of syncope with no warning signs      after a meal.  This is either a vasovagal or cardiogenic.  She does      have a history of coronary artery disease, and her last MI      presented as syncope.  Because of the cardiology, she did have an      echocardiogram which showed normal systolic function with an      ejection  fraction of 65%.  There were no left ventricular regional      wall motion abnormalities.  At this time, no further cardiac      intervention is planned.  This syncope is most likely vasovagal.      She will need a follow-up appointment with Dr. Daleen Squibb in two weeks.  3. Diabetes mellitus:  Her sugars have been controlled while she was      in the hospital.   DISPOSITION:  She is now being discharged home in a stable condition.   FOLLOW UP:  She is asked to follow up with Dr. Redmond School, her primary care  doctor, in 1-2 weeks, and she has an appointment with Dr. Daleen Squibb, the  cardiologist, on January 15, 2008 at 3:30.  She has been given Dr.  Cassandria Santee number, [phone], and she can follow up with Dr. Jeral Fruit if  needed.      Madaline Savage, MD  Electronically Signed     PKN/MEDQ  D:  01/01/2008  T:  01/01/2008  Job:  [id number]

## 2011-04-12 NOTE — Consult Note (Signed)
[name], [name]             ACCOUNT NO.:  [id number]   MEDICAL RECORD NO.:  [id number]          PATIENT TYPE:  INP   LOCATION:  3701                         FACILITY:  [facility]   PHYSICIAN:  Jesse Sans. Wall, MD, FACCDATE OF BIRTH:  Nov 23, 1917   DATE OF CONSULTATION:  12/30/2007  DATE OF DISCHARGE:                                 CONSULTATION   REQUESTING PHYSICIAN:  [facility].   PRIMARY CARDIOLOGIST:  New to [facility], being seen by Dr.  Daleen Squibb.   PRIMARY [name]:  Dr. Redmond School.   PATIENT PROFILE:  [age]-year-old Caucasian female with history of  CAD, who presents with syncope.   PROBLEM LIST:  1. Coronary artery disease.      a.     Status post myocardial infarction approximately 4 years ago       in [place], [place] with 4 stents placed in multiple       arteries.      b.     Reportedly negative stress test about a year ago.  2. Syncope at the time of her myocardial infarction.  3. Hypertension.  4. Diabetes.  5. Rheumatoid arthritis.  6. Anemia.  7. Gastroesophageal reflux disease.   HISTORY OF PRESENT ILLNESS:  [age]-year-old Caucasian female with  history of CAD and syncope, status post 4 stents to assorted vessels in  [place], [place] approximately 4 years ago.  She moved to [place] roughly 2 years ago and thinks she has been seen by Cardiology  here, but she is not sure who.  She also thinks that she had a stress  test 1 or 2 years ago which was reportedly normal; I do not see that in  any [place] records.  When asked if she would like [place] to track down her  cardiologist, she says it has been 2 years and she would prefer just to  see [place] anyway.  Anyway, she was in her usual state of health until  yesterday, when after eating a large breakfast at [facility], she stood up  and turned to get her coat and suddenly lost consciousness without  prodromal symptoms.  She fell forward, striking her head and face, and  then was  subsequently attended to by on-lookers, who assisted her to a  bench.  She apparently did not regain consciousness for approximately 5  minutes and when she did regain consciousness, she felt nauseated.  Again, there were no prodromal symptoms and when she came to, she denied  any chest pain, shortness of breath or diaphoresis.  She presented to  the ED and was admitted by the [facility].  Cardiac markers are  negative and she has had no events on telemetry.  An MRI of her brain  was performed and did show a subdural hematoma for which she saw  Neurosurgery.   ALLERGIES:  PENICILLIN causes hives.  LIPITOR causes rash.   HOME MEDICATIONS:  1. Aspirin 325 mg daily.  2. Omeprazole 20 mg daily.  3. Vicodin half a tablet p.r.n.  4. Glipizide 5 mg b.i.d.  5. Toprol-XL 50 mg daily.  6. Janumet  50/500 one in the a.m., half a tab in the p.m.  7. Hyzaar 50/12.5 b.i.d.   FAMILY HISTORY:  Noncontributory.   SOCIAL HISTORY:  He lives in [place] with her son and daughter-in-law.  She is retired.  She denies tobacco, alcohol or drug use.  She does not  routinely exercise.   REVIEW OF SYSTEMS:  Positive for syncope yesterday.  She also had nausea  when she came to.  She has chronic diabetes as well as arthralgias  secondary to rheumatoid arthritis.   PHYSICAL EXAMINATION:  VITAL SIGNS:  Temperature 97.4, heart rate 70,  respirations 18, blood pressure 116/48, pulse oximetry 96% on room air.  GENERAL:  A pleasant white female in no acute distress, awake, alert and  oriented x3.  NECK:  No bruits or JVD.  LUNGS:  Respirations regular and unlabored with a few crackles at the  right base, otherwise clear to auscultation.  CARDIAC:  Regular, S1 and S2.  No S3, S4 or murmurs.  ABDOMEN:  Round, soft, nontender and non-distended.  Bowel sounds  present x4.  EXTREMITIES:  Warm and dry, pink, no clubbing, cyanosis, or edema.  Dorsalis pedis and posterior tibial pulses 2+ and equal  bilaterally.  HEENT:  Normal with the exception of facial bruising on the left chin.   IMAGING STUDY:  She had an MRI of her brain which showed a large parieto-  occipital SDH with question of tiny amount of hemorrhage in the right  frontal region, moderate small vessel disease.   EKG shows sinus rhythm with a left axis, rate of 66 beats per minute and  T-wave inversion in lead III.   Hemoglobin 10.3, hematocrit 30.6, WBC 10.9, platelets 198,000.  Sodium  136, potassium 3.5, chloride 100, CO2 25, BUN 13, creatinine 1.1,  glucose 163.  TSH 1.483.  Hemoglobin A1c 6.5.  BNP 207.  Cardiac markers  negative x3.  Total cholesterol 123, triglycerides 106, HDL 33, LDL 68,  calcium 8.9.   ASSESSMENT AND PLAN:  1. Syncope, question vasovagal in the setting of a full stomach      occurring after standing versus tachyarrhythmia or possibly      bradyarrhythmia.  She is on beta blocker and has had no      bradyarrhythmia or tachyarrhythmias here.  Echocardiogram is      pending.  Cardiac enzymes are negative.  ECG is without acute ST or      T changes.  We will supplement potassium and check magnesium.  TSH      is normal.  If echocardiogram is normal, would plan conservative      management with outpatient followup, which may include an event      monitor.  The ejection fraction is depressed and would consider      ischemic evaluation, although with history of subdural hematoma,      she is not an ideal catheterization candidate.  It would be helpful      to have her old echocardiogram and catheterization records.  2. Coronary artery disease.  Cardiac enzymes negative.  She has no      chest pain.  Hold aspirin secondary to subdural hematoma.  Continue      beta blocker and angiotensin II receptor blocker and we will add      low-dose statin.  3. Subdural hematoma.  Followup MRI per Neurosurgery.  4. Diabetes mellitus.  Per primary team.  5. Lipids.  The patient has a normal lipid profile with  an LDL of 69.      She is not currently on a statin.  Given her history of diabetes,      she would likely benefit from at least a low-dose statin.      Nicolasa Ducking, ANP      Jesse Sans. Daleen Squibb, MD, [facility]  Electronically Signed    CB/MEDQ  D:  12/30/2007  T:  12/31/2007  Job:  [id number]

## 2011-04-15 NOTE — Assessment & Plan Note (Signed)
[facility]                         GASTROENTEROLOGY OFFICE NOTE   NAME:Wisenbaker, Carlena                      MRN:          [id number]  DATE:03/28/2007                            DOB:          12-17-1916    REASON FOR REFERRAL:  Anemia and gastroesophageal reflux disease.   HISTORY OF PRESENT ILLNESS:  Ms. Kildow is an 77 year-old white female  who is here today with her daughter-in-law. She states that she was told  of anemia diagnosed by Dr. Redmond School recently. Unfortunately, I have no  referral information. The patient apparently saw Dr.  Elease Hashimoto for  evaluation in November or December of this past year. The patient also  sees Dr. Kellie Simmering. She has had problems with heartburn, indigestion and  reflux symptoms. Her symptoms improved on omeprazole, however, this led  to worsening constipation. She notes no abdominal pain, weight loss,  change in bowel habits, change in stool caliber, melena or hematochezia.  She moved to [place] from [place], [place] about one year ago.  She states that she had a colonoscopy performed by Dr. Sela Hua in  [place], [place] in 2004, that was negative. There is no family  history of colon cancer, colon polyps or inflammatory bowel disease.   PAST MEDICAL HISTORY:  1. Coronary artery disease, status post myocardial infarction, status      post four cardiac stents placed.  2. Diabetes mellitus.  3. Rheumatoid arthritis.   CURRENT MEDICATIONS:  Listed on the chart, updated and reviewed. The  patient and daughter-in-law do not have the exact dosage or schedule of  any medications except for omeprazole.   MEDICATION ALLERGIES:  PENICILLIN.   SOCIAL HISTORY AND REVIEW OF SYSTEMS:  As per the handwritten form.   PHYSICAL EXAMINATION:  Elderly white female in no acute distress. Weight  113 pounds, blood pressure 118/72, pulse 80 and regular.  HEENT: Anicteric sclerae. Oropharynx clear.  CHEST: Clear to auscultation  bilaterally.  CARDIAC: Regular rate and rhythm without murmurs.  ABDOMEN: Soft and nontender. Nondistended. Normoactive bowel sounds. No  palpable organomegaly, masses or hernias.  NEUROLOGIC: Alert and oriented x3. Grossly nonfocal.   ASSESSMENT/PLAN:  1. Reflux symptoms. Due to side effects of constipation from      omeprazole we will begin Prevacid samples 30 mg p.o. q a.m. for the      next two weeks and assess the response. Begin all standard anti-      reflux measures.  2. Anemia. Will obtain records from Dr. Redmond School, Dr. Elease Hashimoto and Dr.      Kellie Simmering and have the      patient return for followup in two weeks. She may need Hemocults,      additional blood work and colonoscopy. Will review records and      discuss at her F/U appt.     Venita Lick. Russella Dar, MD, [facility]  Electronically Signed    MTS/MedQ  DD: 03/28/2007  DT: 03/28/2007  Job #: [id number]   cc:   Florentina Jenny, MD

## 2011-08-18 LAB — POCT CARDIAC MARKERS
CKMB, poc: 1
CKMB, poc: 1.2
Myoglobin, poc: 168
Troponin i, poc: <0.05
Troponin i, poc: <0.05

## 2011-08-18 LAB — CBC
HCT: 34 — ABNORMAL LOW
Hemoglobin: 11.3 — ABNORMAL LOW
MCHC: 33.3
MCV: 90.1
Platelets: 234
RBC: 3.78 — ABNORMAL LOW
RDW: 14.4
WBC: 11.3 — ABNORMAL HIGH

## 2011-08-18 LAB — BASIC METABOLIC PANEL
BUN: 13
CO2: 28
Calcium: 9.1
Chloride: 100
Creatinine, Ser: 1.09
GFR calc Af Amer: 57 — ABNORMAL LOW
GFR calc non Af Amer: 47 — ABNORMAL LOW
Glucose, Bld: 146 — ABNORMAL HIGH
Potassium: 4.5
Sodium: 134 — ABNORMAL LOW

## 2011-08-18 LAB — DIFFERENTIAL
Basophils Absolute: 0.1
Basophils Relative: 1
Eosinophils Absolute: 0.6
Eosinophils Relative: 6 — ABNORMAL HIGH
Lymphocytes Relative: 17
Lymphs Abs: 1.9
Monocytes Absolute: 0.9
Monocytes Relative: 8
Neutro Abs: 7.8 — ABNORMAL HIGH
Neutrophils Relative %: 69

## 2011-08-18 LAB — B-NATRIURETIC PEPTIDE (CONVERTED LAB): Pro B Natriuretic peptide (BNP): 207 — ABNORMAL HIGH

## 2011-08-18 LAB — URINALYSIS, ROUTINE W REFLEX MICROSCOPIC
Bilirubin Urine: NEGATIVE
Glucose, UA: NEGATIVE
Hgb urine dipstick: NEGATIVE
Ketones, ur: NEGATIVE
Nitrite: NEGATIVE
Protein, ur: NEGATIVE
Specific Gravity, Urine: 1.009
Urobilinogen, UA: 0.2
pH: 6.5

## 2011-08-18 LAB — TROPONIN I: Troponin I: <0.01

## 2011-08-18 LAB — HEMOGLOBIN A1C: Hgb A1c MFr Bld: 6.5 — ABNORMAL HIGH

## 2011-08-18 LAB — CK TOTAL AND CKMB (NOT AT ARMC)
CK, MB: 2.8
Relative Index: 2.1

## 2011-08-18 LAB — TSH: TSH: 1.483

## 2011-08-19 LAB — CARDIAC PANEL(CRET KIN+CKTOT+MB+TROPI)
Relative Index: INVALID
Relative Index: INVALID
Relative Index: INVALID
Relative Index: INVALID
Total CK: 49
Total CK: 86
Troponin I: 0.01
Troponin I: 0.01
Troponin I: <0.01

## 2011-08-19 LAB — BASIC METABOLIC PANEL
BUN: 13
CO2: 29
Calcium: 9.2
Chloride: 100
Creatinine, Ser: 1.1
Creatinine, Ser: 1.11
GFR calc Af Amer: 56 — ABNORMAL LOW
GFR calc non Af Amer: 46 — ABNORMAL LOW
Glucose, Bld: 163 — ABNORMAL HIGH
Potassium: 3.5

## 2011-08-19 LAB — MAGNESIUM: Magnesium: 1.5

## 2011-08-19 LAB — LIPID PANEL
HDL: 33 — ABNORMAL LOW
Total CHOL/HDL Ratio: 3.7

## 2011-08-19 LAB — DIFFERENTIAL
Basophils Absolute: 0
Basophils Relative: 0
Eosinophils Absolute: 0.6
Neutro Abs: 6.4
Neutrophils Relative %: 58

## 2011-08-19 LAB — CBC
MCHC: 33.3
MCHC: 33.5
MCV: 90.2
Platelets: 198
RBC: 3.56 — ABNORMAL LOW
RDW: 14.3
RDW: 14.4

## 2014-10-22 ENCOUNTER — Ambulatory Visit: Payer: Self-pay | Admitting: Podiatry

## 2014-10-29 ENCOUNTER — Ambulatory Visit (INDEPENDENT_AMBULATORY_CARE_PROVIDER_SITE_OTHER): Payer: Medicare Other | Admitting: Podiatry

## 2014-10-29 ENCOUNTER — Encounter: Payer: Self-pay | Admitting: Podiatry

## 2014-10-29 VITALS — BP 121/60 | HR 75 | Resp 18

## 2014-10-29 DIAGNOSIS — M2040 Other hammer toe(s) (acquired), unspecified foot: Secondary | ICD-10-CM

## 2014-10-29 DIAGNOSIS — E1149 Type 2 diabetes mellitus with other diabetic neurological complication: Secondary | ICD-10-CM

## 2014-10-29 DIAGNOSIS — B351 Tinea unguium: Secondary | ICD-10-CM

## 2014-10-29 DIAGNOSIS — E1140 Type 2 diabetes mellitus with diabetic neuropathy, unspecified: Secondary | ICD-10-CM

## 2014-10-29 DIAGNOSIS — M2010 Hallux valgus (acquired), unspecified foot: Secondary | ICD-10-CM

## 2014-10-29 DIAGNOSIS — L84 Corns and callosities: Secondary | ICD-10-CM

## 2014-10-29 DIAGNOSIS — M79676 Pain in unspecified toe(s): Secondary | ICD-10-CM

## 2014-10-29 NOTE — Progress Notes (Signed)
   Subjective:    Patient ID: [name], female    DOB: 09-05-1917, 80 y.o.   MRN: [id number]  HPI  80 year old female presents the office today for complaints of a painful lesion on the bottom of her left foot which is been ongoing for approximate 3 weeks. She stated lesions X been present for longer but has become more symptomatic recently. She is diabetic and states that her blood sugars typically is controlled. Unsure of her last HbA1c. She has some intermittent tingling to the toes. Denies any claudication symptoms. She has periodic cramping to the toes. Nails are painful particularly with shoe gear. Denies any redness or drainage from the nail sites. No other complaints at this time.     Review of Systems  HENT: Positive for hearing loss and sinus pressure.   Respiratory:       Had pneumonia  Gastrointestinal: Positive for diarrhea and constipation.  Musculoskeletal: Positive for gait problem.       Joint pain and muscle pain  All other systems reviewed and are negative.      Objective:   Physical Exam AAO 3, NAD DP/PT pulses palpable bilaterally, CRT less than 3 seconds Decreased protective sensation with Sims once the monofilament, decreased vibratory sensation. Achilles tendon reflex intact. Severe HAV deformity present bilaterally with the second and third digit overlapping the hallux and the left in the second digit overlapping the hallux on the right. Hammertoe contractures present. Decrease in medial arch height upon weightbearing. Pre-ulcerative calluses to the left foot second metatarsal 4 on the right foot submetatarsal 3. Upon debridement a lesion on the left foot there is a small pinpoint superficial opening the skin without any surrounding erythema or drainage. No purulence or drainage noted. No clinical signs of infection. Also pre-ulcerative pinch calluses to the medial aspect of the first MTPJ. Nails hypertrophic, dystrophic, brittle, discolored, elongated 10.  No swelling erythema or drainage. No other lesions identified. No pain with calf compression, swelling, warmth, erythema.       Assessment & Plan:  80 year old female with symptomatic onychomycosis, pre-ulcerative calluses bilateral foot -Treatment options including alternatives, risks, competitions were discussed with the patient. -Pre-ulcerative lesions were sharply debrided without competitions. On the left foot there was a small superficial pinpoint opening in the central aspect of the callus submetatarsal 4. Discussed with patient this area clean. Can apply a small amount of antibiotic ointment and a Band-Aid to the area until completely healed. Monitoring clinical signs or symptoms of infection and directed to call the office immediately if any are to occur or go to the emergency room. -Dispensed offloading pads to help offload the symptomatic areas.  -Nail sharply debrided 10 without complications. -Due to the foot deformity with painful lesions completed paperwork to get diabetic shoes preauthorized. Also missed her primary care physician. -Discussed importance of daily foot inspection. -Follow-up in 2 weeks if the area foot is not completely healed on the left or sooner if the worsen. Otherwise, follow-up in 3 months. Call with any questions, concerns, changes symptoms. Follow-up with PCP for other issues mentioned in the ROS.

## 2014-10-29 NOTE — Patient Instructions (Signed)
Diabetes and Foot Care Diabetes may cause you to have problems because of poor blood supply (circulation) to your feet and legs. This may cause the skin on your feet to become thinner, break easier, and heal more slowly. Your skin may become dry, and the skin may peel and crack. You may also have nerve damage in your legs and feet causing decreased feeling in them. You may not notice minor injuries to your feet that could lead to infections or more serious problems. Taking care of your feet is one of the most important things you can do for yourself.  HOME CARE INSTRUCTIONS  Wear shoes at all times, even in the house. Do not go barefoot. Bare feet are easily injured.  Check your feet daily for blisters, cuts, and redness. If you cannot see the bottom of your feet, use a mirror or ask someone for help.  Wash your feet with warm water (do not use hot water) and mild soap. Then pat your feet and the areas between your toes until they are completely dry. Do not soak your feet as this can dry your skin.  Apply a moisturizing lotion or petroleum jelly (that does not contain alcohol and is unscented) to the skin on your feet and to dry, brittle toenails. Do not apply lotion between your toes.  Trim your toenails straight across. Do not dig under them or around the cuticle. File the edges of your nails with an emery board or nail file.  Do not cut corns or calluses or try to remove them with medicine.  Wear clean socks or stockings every day. Make sure they are not too tight. Do not wear knee-high stockings since they may decrease blood flow to your legs.  Wear shoes that fit properly and have enough cushioning. To break in new shoes, wear them for just a few hours a day. This prevents you from injuring your feet. Always look in your shoes before you put them on to be sure there are no objects inside.  Do not cross your legs. This may decrease the blood flow to your feet.  If you find a minor scrape,  cut, or break in the skin on your feet, keep it and the skin around it clean and dry. These areas may be cleansed with mild soap and water. Do not cleanse the area with peroxide, alcohol, or iodine.  When you remove an adhesive bandage, be sure not to damage the skin around it.  If you have a wound, look at it several times a day to make sure it is healing.  Do not use heating pads or hot water bottles. They may burn your skin. If you have lost feeling in your feet or legs, you may not know it is happening until it is too late.  Make sure your health care provider performs a complete foot exam at least annually or more often if you have foot problems. Report any cuts, sores, or bruises to your health care provider immediately. SEEK MEDICAL CARE IF:   You have an injury that is not healing.  You have cuts or breaks in the skin.  You have an ingrown nail.  You notice redness on your legs or feet.  You feel burning or tingling in your legs or feet.  You have pain or cramps in your legs and feet.  Your legs or feet are numb.  Your feet always feel cold. SEEK IMMEDIATE MEDICAL CARE IF:   There is increasing redness,   swelling, or pain in or around a wound.  There is a red line that goes up your leg.  Pus is coming from a wound.  You develop a fever or as directed by your health care provider.  You notice a bad smell coming from an ulcer or wound. Document Released: 11/11/2000 Document Revised: 07/17/2013 Document Reviewed: 04/23/2013 ExitCare Patient Information 2015 [employer]. This information is not intended to replace advice given to you by your health care provider. Make sure you discuss any questions you have with your health care provider.  

## 2014-12-10 ENCOUNTER — Ambulatory Visit (INDEPENDENT_AMBULATORY_CARE_PROVIDER_SITE_OTHER): Payer: Medicare Other | Admitting: *Deleted

## 2014-12-10 DIAGNOSIS — M2040 Other hammer toe(s) (acquired), unspecified foot: Secondary | ICD-10-CM

## 2014-12-10 NOTE — Progress Notes (Signed)
   Subjective:    Patient ID: [name], female    DOB: 1917/08/05, 81 y.o.   MRN: [id number]  HPI DIABETIC MEASUREMENT.   Review of Systems     Objective:   Physical Exam        Assessment & Plan:

## 2015-01-02 ENCOUNTER — Ambulatory Visit: Payer: Medicare Other | Admitting: Podiatry

## 2015-01-07 ENCOUNTER — Ambulatory Visit (INDEPENDENT_AMBULATORY_CARE_PROVIDER_SITE_OTHER): Payer: Medicare Other | Admitting: Podiatry

## 2015-01-07 ENCOUNTER — Encounter: Payer: Self-pay | Admitting: Podiatry

## 2015-01-07 DIAGNOSIS — E1149 Type 2 diabetes mellitus with other diabetic neurological complication: Secondary | ICD-10-CM

## 2015-01-07 DIAGNOSIS — E1140 Type 2 diabetes mellitus with diabetic neuropathy, unspecified: Secondary | ICD-10-CM

## 2015-01-12 NOTE — Progress Notes (Signed)
Patient ID: [name], female   DOB: Sep 09, 1917, 81 y.o.   MRN: [id number]  She presented the office today to pick up diabetic shoes. However upon trying that she is on the did not fit appropriately. Because of this they were sent back and she was remeasured. No new complaints at this time.

## 2015-01-23 ENCOUNTER — Ambulatory Visit (INDEPENDENT_AMBULATORY_CARE_PROVIDER_SITE_OTHER): Payer: Medicare Other | Admitting: Podiatry

## 2015-01-23 DIAGNOSIS — M2010 Hallux valgus (acquired), unspecified foot: Secondary | ICD-10-CM

## 2015-01-23 DIAGNOSIS — M2040 Other hammer toe(s) (acquired), unspecified foot: Secondary | ICD-10-CM

## 2015-01-23 DIAGNOSIS — E1149 Type 2 diabetes mellitus with other diabetic neurological complication: Secondary | ICD-10-CM

## 2015-01-23 DIAGNOSIS — L84 Corns and callosities: Secondary | ICD-10-CM

## 2015-01-23 DIAGNOSIS — E1140 Type 2 diabetes mellitus with diabetic neuropathy, unspecified: Secondary | ICD-10-CM

## 2015-01-23 NOTE — Patient Instructions (Signed)

## 2015-01-23 NOTE — Progress Notes (Signed)
Patient ID: [name], female   DOB: 06-17-1917, 81 y.o.   MRN: [id number] PICKING UP DIABETIC SHOES  ORTH[employer] 6 EXTRA WIDE  3 PAIRS CUSTOM DIABETIC ACCOMMODATIVE INSERTS  SHOES ARE TRIED ON FOR GOOD FIT  WRITTEN BREAK IN AND WEAR INSTRUCTIONS ARE GIVEN. MONITOR FOR ANY SKIN CHANGES OR ANY PROBLEMS. DIRECTED TO CALL IMMEDIATELY SHOULD ANY OCCUR.  NO NEW COMPLAINTS AT TODAY'S APPOINTMENT. FOLLOW-UP AS SCHEDULED, OR SOONER SHOULD ANY PROBLEMS ARISE.

## 2015-01-28 ENCOUNTER — Ambulatory Visit: Payer: TRICARE For Life (TFL) | Admitting: Podiatry

## 2015-02-04 ENCOUNTER — Ambulatory Visit (INDEPENDENT_AMBULATORY_CARE_PROVIDER_SITE_OTHER): Payer: Medicare Other | Admitting: Podiatry

## 2015-02-04 ENCOUNTER — Encounter: Payer: Self-pay | Admitting: Podiatry

## 2015-02-04 VITALS — BP 123/76 | HR 104 | Resp 18

## 2015-02-04 DIAGNOSIS — E1140 Type 2 diabetes mellitus with diabetic neuropathy, unspecified: Secondary | ICD-10-CM | POA: Diagnosis not present

## 2015-02-04 DIAGNOSIS — M2040 Other hammer toe(s) (acquired), unspecified foot: Secondary | ICD-10-CM

## 2015-02-04 DIAGNOSIS — B351 Tinea unguium: Secondary | ICD-10-CM | POA: Diagnosis not present

## 2015-02-04 DIAGNOSIS — M2010 Hallux valgus (acquired), unspecified foot: Secondary | ICD-10-CM

## 2015-02-04 DIAGNOSIS — E1149 Type 2 diabetes mellitus with other diabetic neurological complication: Secondary | ICD-10-CM

## 2015-02-04 DIAGNOSIS — Q828 Other specified congenital malformations of skin: Secondary | ICD-10-CM | POA: Diagnosis not present

## 2015-02-04 DIAGNOSIS — M79676 Pain in unspecified toe(s): Secondary | ICD-10-CM

## 2015-02-05 NOTE — Progress Notes (Signed)
Patient ID: [name], female   DOB: Oct 22, 1917, 81 y.o.   MRN: [id number]  Subjective: 81 y.o.-year-old female returns the office today for painful, elongated, thickened toenails. Denies any redness or drainage around the nails. Also states that since last appointment after pick and the diabetic shoes she is unable to wear them as they rab of her hammertoes. She is inquiring about alternative shoes. Denies any acute changes since last appointment and no new complaints today. Denies any systemic complaints such as fevers, chills, nausea, vomiting.   Objective: AAO 3, NAD DP/PT pulses palpable, CRT less than 3 seconds Protective sensation decreased with Simms Weinstein monofilament, Achilles tendon reflex intact.  Nails hypertrophic, dystrophic, elongated, brittle, discolored 10. There is tenderness overlying these nails 1-5 bilaterally. There is no surrounding erythema or drainage along the nail sites. Small annular hyperkeratotic lesions bilateral submetatarsal 2. No open lesions or other pre-ulcerative lesions are identified. 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 There is evidence of mild redness from irritation along the bunion site on the dorsal aspect of the digits. There is no open lesions over those areas at this time. No other areas of tenderness bilateral lower extremities. No overlying edema, erythema, increased warmth. No pain with calf compression, swelling, warmth, erythema.  Assessment: Patient presents with symptomatic onychomycosis, porokeratosis, HAV/Hammertoes  Plan: -Treatment options including alternatives, risks, complications were discussed -Nails sharply debrided 10 without complication/bleeding. -Hyperkeratotic lesion sharply debrided 2 without complication/bleeding -As the diabetic shoes are not fitting they will be returned. We picked out a new style of shoe that would likely  fit her foot type better. -Discussed daily foot inspection. If there are any changes, to call the office immediately.  -Follow-up in 3 months or sooner if any problems are to arise. In the meantime, encouraged to call the office with any questions, concerns, changes symptoms.

## 2015-02-18 ENCOUNTER — Ambulatory Visit: Payer: Medicare Other | Admitting: *Deleted

## 2015-02-18 DIAGNOSIS — E1149 Type 2 diabetes mellitus with other diabetic neurological complication: Secondary | ICD-10-CM

## 2015-02-18 NOTE — Progress Notes (Signed)
Patient ID: [name], female   DOB: 11-21-1917, 81 y.o.   MRN: [id number] PICKING UP REORDERED SHOES

## 2015-03-02 ENCOUNTER — Telehealth: Payer: Self-pay | Admitting: Podiatry

## 2015-03-02 NOTE — Telephone Encounter (Signed)
PTS DAUGHTER IN LAW CALLED AND SAID PTS DIABETIC SHOES DO NOT FIT PT. PLEASE CALL HER BACK.HER CELL # IS [phone].

## 2015-04-01 ENCOUNTER — Ambulatory Visit: Payer: Medicare Other | Admitting: *Deleted

## 2015-04-01 DIAGNOSIS — E1149 Type 2 diabetes mellitus with other diabetic neurological complication: Secondary | ICD-10-CM

## 2015-04-02 NOTE — Patient Instructions (Signed)

## 2015-04-02 NOTE — Progress Notes (Signed)
Patient ID: [name], female   DOB: March 22, 1917, 81 y.o.   MRN: [id number] Patient presents to pick up shoes again patient seems happy with the shoe they fit well.  Unfortunately this is our last option for her if they do not fit we will have to send her to have specialty shoes made.

## 2015-05-06 ENCOUNTER — Ambulatory Visit (INDEPENDENT_AMBULATORY_CARE_PROVIDER_SITE_OTHER): Payer: Medicare Other | Admitting: Podiatry

## 2015-05-06 ENCOUNTER — Encounter: Payer: Self-pay | Admitting: Podiatry

## 2015-05-06 DIAGNOSIS — E1149 Type 2 diabetes mellitus with other diabetic neurological complication: Secondary | ICD-10-CM

## 2015-05-06 DIAGNOSIS — E1140 Type 2 diabetes mellitus with diabetic neuropathy, unspecified: Secondary | ICD-10-CM | POA: Diagnosis not present

## 2015-05-06 DIAGNOSIS — M79676 Pain in unspecified toe(s): Secondary | ICD-10-CM | POA: Diagnosis not present

## 2015-05-06 DIAGNOSIS — B351 Tinea unguium: Secondary | ICD-10-CM | POA: Diagnosis not present

## 2015-05-06 NOTE — Progress Notes (Signed)
Patient ID: Maryjean KaMargaret Mahnke, female   DOB: October 17, 1917, 81 y.o.   MRN: [id number]   Subjective: [name] returns the office today for painful, elongated, thickened toenails which she is unable to trim herself. Denies any redness or drainage around the nails. She also has painful calluses to the balls of the feet. Denies any redness or drainage around the callus sites. Denies any acute changes since last appointment and no new complaints today. Denies any systemic complaints such as fevers, chills, nausea, vomiting.   Objective: AAO 3, NAD DP/PT pulses palpable, CRT less than 3 seconds Protective sensation decreased with Simms Weinstein monofilament, Achilles tendon reflex intact.  Nails hypertrophic, dystrophic, elongated, brittle, discolored 10. There is tenderness overlying the nails 1-5 bilaterally. There is no surrounding erythema or drainage along the nail sites. Small annular hyperkeratotic lesions bilateral submetatarsal 2. No open lesions or other pre-ulcerative lesions are identified. 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  There are no open lesions over those areas at this time. No other areas of tenderness bilateral lower extremities. No overlying edema, erythema, increased warmth. No pain with calf compression, swelling, warmth, erythema.  Assessment: Patient presents with symptomatic onychomycosis, porokeratosis  Plan: -Treatment options including alternatives, risks, complications were discussed -Nails sharply debrided 10 without complication/bleeding. -Hyperkeratotic lesion sharply debrided 2 without complication/bleeding -Discussed daily foot inspection. If there are any changes, to call the office immediately.  -Follow-up in 3 months or sooner if any problems are to arise. In the meantime, encouraged to call the office with any questions, concerns, changes symptoms.

## 2015-05-08 ENCOUNTER — Ambulatory Visit: Payer: Medicare Other | Admitting: Podiatry

## 2015-08-07 ENCOUNTER — Ambulatory Visit (INDEPENDENT_AMBULATORY_CARE_PROVIDER_SITE_OTHER): Payer: Medicare Other | Admitting: Podiatry

## 2015-08-07 ENCOUNTER — Encounter: Payer: Self-pay | Admitting: Podiatry

## 2015-08-07 VITALS — BP 136/72 | HR 68 | Resp 18

## 2015-08-07 DIAGNOSIS — Q828 Other specified congenital malformations of skin: Secondary | ICD-10-CM | POA: Diagnosis not present

## 2015-08-07 DIAGNOSIS — E1149 Type 2 diabetes mellitus with other diabetic neurological complication: Secondary | ICD-10-CM

## 2015-08-07 DIAGNOSIS — B351 Tinea unguium: Secondary | ICD-10-CM | POA: Diagnosis not present

## 2015-08-07 DIAGNOSIS — E1140 Type 2 diabetes mellitus with diabetic neuropathy, unspecified: Secondary | ICD-10-CM | POA: Diagnosis not present

## 2015-08-07 DIAGNOSIS — M79676 Pain in unspecified toe(s): Secondary | ICD-10-CM | POA: Diagnosis not present

## 2015-08-10 NOTE — Progress Notes (Signed)
Patient ID: [name], female   DOB: 02/09/1917, 81 y.o.   MRN: [id number]   Subjective: [name] returns the office today for painful, elongated, thickened toenails which she is unable to trim herself. . She also continues to have painful calluses to the balls of the feet. Denies any redness or drainage around the callus/nail sites. Denies any acute changes since last appointment and no new complaints today. Denies any systemic complaints such as fevers, chills, nausea, vomiting.   Objective: AAO 3, NAD DP/PT pulses palpable, CRT less than 3 seconds Protective sensation decreased with Simms Weinstein monofilament Nails hypertrophic, dystrophic, elongated, brittle, discolored 10. There is tenderness overlying the nails 1-5 bilaterally. There is no surrounding erythema or drainage along the nail sites. Small annular hyperkeratotic lesions bilateral submetatarsal 2. No open lesions or other pre-ulcerative lesions are identified. 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  There are no open lesions over those areas at this time. No other areas of tenderness bilateral lower extremities. No overlying edema, erythema, increased warmth. No pain with calf compression, swelling, warmth, erythema.  Assessment: Patient presents with symptomatic onychomycosis, porokeratosis  Plan: -Treatment options including alternatives, risks, complications were discussed -Nails sharply debrided 10 without complication/bleeding. -Hyperkeratotic lesion sharply debrided 2 without complication/bleeding -Discussed daily foot inspection. If there are any changes, to call the office immediately.  -Follow-up in 3 months or sooner if any problems are to arise. In the meantime, encouraged to call the office with any questions, concerns, changes symptoms.  Thiago Ragsdale, DPM 

## 2015-11-13 ENCOUNTER — Ambulatory Visit (INDEPENDENT_AMBULATORY_CARE_PROVIDER_SITE_OTHER): Payer: Medicare Other | Admitting: Podiatry

## 2015-11-13 DIAGNOSIS — M79676 Pain in unspecified toe(s): Secondary | ICD-10-CM | POA: Diagnosis not present

## 2015-11-13 DIAGNOSIS — B351 Tinea unguium: Secondary | ICD-10-CM

## 2015-11-13 DIAGNOSIS — E1149 Type 2 diabetes mellitus with other diabetic neurological complication: Secondary | ICD-10-CM | POA: Diagnosis not present

## 2015-11-13 DIAGNOSIS — Q828 Other specified congenital malformations of skin: Secondary | ICD-10-CM

## 2015-11-13 NOTE — Progress Notes (Signed)
Patient ID: [name], female   DOB: 13-Mar-1917, 81 y.o.   MRN: [id number]   Subjective: [name] returns the office today for painful, elongated, thickened toenails which she is unable to trim herself. . She also continues to have painful calluses to the balls of the feet. Denies any redness or drainage around the callus/nail sites. Denies any acute changes since last appointment and no new complaints today. Denies any systemic complaints such as fevers, chills, nausea, vomiting.   Objective: AAO 3, NAD DP/PT pulses palpable, CRT less than 3 seconds Protective sensation decreased with Simms Weinstein monofilament Nails hypertrophic, dystrophic, elongated, brittle, discolored 10. There is tenderness overlying the nails 1-5 bilaterally. There is no surrounding erythema or drainage along the nail sites. Small annular hyperkeratotic lesions bilateral submetatarsal 2. No open lesions or other pre-ulcerative lesions are identified. 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  There are no open lesions over those areas at this time. No other areas of tenderness bilateral lower extremities. No overlying edema, erythema, increased warmth. No pain with calf compression, swelling, warmth, erythema.  Assessment: Patient presents with symptomatic onychomycosis, porokeratosis  Plan: -Treatment options including alternatives, risks, complications were discussed -Nails sharply debrided 10 without complication/bleeding. -Hyperkeratotic lesion sharply debrided 2 without complication/bleeding -Discussed daily foot inspection. If there are any changes, to call the office immediately.  -Follow-up in 3 months or sooner if any problems are to arise. In the meantime, encouraged to call the office with any questions, concerns, changes symptoms.  Ovid CurdMatthew Wagoner, DPM

## 2015-11-22 NOTE — Progress Notes (Signed)
Subjective:     Patient ID: [name], female   DOB: 1917/10/10, 81 y.o.   MRN: [id number]  HPI patient presents with painful nails 1-5 of both feet and lesions underneath both feet that she cannot cut. She has a caregiver with her and states that they are tender   Review of Systems     Objective:   Physical Exam  neurovascular status was found to be unchanged with patient having thick yellow brittle nailbeds 1-5 both feet that are painful when pressed and impossible for her to cut. Lesion subsecond metatarsal bilateral that are thick and  Cannot be taken care of by patient    Assessment:      mycotic nail infection 1-5 both feet and lesions plantar aspect both feet    Plan:      H&P and conditions reviewed with patient. Debrided nailbeds 1-5 both feet and lesions on the bottom of both feet with no iatrogenic bleeding noted

## 2016-08-05 ENCOUNTER — Emergency Department (HOSPITAL_BASED_OUTPATIENT_CLINIC_OR_DEPARTMENT_OTHER): Payer: Medicare Other

## 2016-08-05 ENCOUNTER — Encounter (HOSPITAL_BASED_OUTPATIENT_CLINIC_OR_DEPARTMENT_OTHER): Payer: Self-pay | Admitting: Emergency Medicine

## 2016-08-05 ENCOUNTER — Emergency Department (HOSPITAL_BASED_OUTPATIENT_CLINIC_OR_DEPARTMENT_OTHER)
Admission: EM | Admit: 2016-08-05 | Discharge: 2016-08-05 | Disposition: A | Discharge status: Skilled Nursing Facility | Payer: Medicare Other | Attending: Emergency Medicine | Admitting: Emergency Medicine

## 2016-08-05 DIAGNOSIS — I10 Essential (primary) hypertension: Secondary | ICD-10-CM | POA: Diagnosis not present

## 2016-08-05 DIAGNOSIS — Y9301 Activity, walking, marching and hiking: Secondary | ICD-10-CM | POA: Insufficient documentation

## 2016-08-05 DIAGNOSIS — Y999 Unspecified external cause status: Secondary | ICD-10-CM | POA: Diagnosis not present

## 2016-08-05 DIAGNOSIS — S12121A Other nondisplaced dens fracture, initial encounter for closed fracture: Secondary | ICD-10-CM

## 2016-08-05 DIAGNOSIS — Z79899 Other long term (current) drug therapy: Secondary | ICD-10-CM | POA: Insufficient documentation

## 2016-08-05 DIAGNOSIS — Y929 Unspecified place or not applicable: Secondary | ICD-10-CM | POA: Diagnosis not present

## 2016-08-05 DIAGNOSIS — Z7984 Long term (current) use of oral hypoglycemic drugs: Secondary | ICD-10-CM | POA: Insufficient documentation

## 2016-08-05 DIAGNOSIS — M25511 Pain in right shoulder: Secondary | ICD-10-CM | POA: Insufficient documentation

## 2016-08-05 DIAGNOSIS — S12112A Nondisplaced Type II dens fracture, initial encounter for closed fracture: Secondary | ICD-10-CM | POA: Diagnosis not present

## 2016-08-05 DIAGNOSIS — S0003XA Contusion of scalp, initial encounter: Secondary | ICD-10-CM | POA: Diagnosis not present

## 2016-08-05 DIAGNOSIS — W0110XA Fall on same level from slipping, tripping and stumbling with subsequent striking against unspecified object, initial encounter: Secondary | ICD-10-CM | POA: Diagnosis not present

## 2016-08-05 DIAGNOSIS — M25561 Pain in right knee: Secondary | ICD-10-CM | POA: Diagnosis not present

## 2016-08-05 DIAGNOSIS — E119 Type 2 diabetes mellitus without complications: Secondary | ICD-10-CM | POA: Diagnosis not present

## 2016-08-05 DIAGNOSIS — Z7982 Long term (current) use of aspirin: Secondary | ICD-10-CM | POA: Insufficient documentation

## 2016-08-05 DIAGNOSIS — S0990XA Unspecified injury of head, initial encounter: Secondary | ICD-10-CM | POA: Diagnosis present

## 2016-08-05 LAB — URINALYSIS, ROUTINE W REFLEX MICROSCOPIC
Bilirubin Urine: NEGATIVE
GLUCOSE, UA: NEGATIVE mg/dL
Hgb urine dipstick: NEGATIVE
KETONES UR: NEGATIVE mg/dL
NITRITE: NEGATIVE
PH: 7.5 (ref 5.0–8.0)
PROTEIN: 30 mg/dL — AB
Specific Gravity, Urine: 1.006 (ref 1.005–1.030)

## 2016-08-05 LAB — URINE MICROSCOPIC-ADD ON

## 2016-08-05 MED ORDER — ACETAMINOPHEN 500 MG PO TABS: 500.0000 mg | ORAL_TABLET | Freq: Four times a day (QID) | ORAL | 0 refills | Status: AC | PRN

## 2016-08-05 MED ORDER — ACETAMINOPHEN 325 MG PO TABS
650.0000 mg | ORAL_TABLET | Freq: Once | ORAL | Status: AC
  Administered 2016-08-05: 650 mg via ORAL
  Filled 2016-08-05: qty 2

## 2016-08-05 NOTE — Discharge Instructions (Signed)
You were seen today for a fall. You have broken one of the vertebra in your neck. You need to keep the cervical collar on at all times. Follow-up with neurosurgery in one month. If you develop weakness or tingling in the extremities you should be reevaluated.

## 2016-08-05 NOTE — ED Triage Notes (Signed)
Patient lives at an assisted living and got out of bed and ambulated without her walker. Patient fell and hit her right forehead. Fall was unwitnessed. Patient denies any neck or back pain at this time. Hematoma noted to the left forehead.

## 2016-08-05 NOTE — ED Notes (Signed)
Delay in EKG due to pt going to CT.

## 2016-08-05 NOTE — ED Provider Notes (Signed)
[facility]   CSN: [id number] Arrival date & time: 08/05/16  0116     History   Chief Complaint Chief Complaint  Patient presents with  . Fall    HPI [name] is a 82 y.o. female.  HPI  This is a 82 year old female who presents from her living facility following a fall. Patient reports that she was getting up to go to the restroom when she fell. She remembers falling. She denies syncope. She states that she tripped and fell. She normally walks with a walker but did not have her walker. The fall was unwitnessed. Patient reports right shoulder, right knee pain, and head pain. Denies any recent illnesses.  Past Medical History:  Diagnosis Date  . Diabetes mellitus without complication (HCC)   . GERD (gastroesophageal reflux disease)   . Hypertension   . Stroke (HCC)   . Vertigo     There are no active problems to display for this patient.   History reviewed. No pertinent surgical history.  OB History    No data available       Home Medications    Prior to Admission medications   Medication Sig Start Date End Date Taking? Authorizing Provider  acetaminophen (TYLENOL) 500 MG tablet Take 1 tablet (500 mg total) by mouth every 6 (six) hours as needed. 08/05/16   Shon Baton, MD  amLODipine (NORVASC) 10 MG tablet Take 10 mg by mouth daily.    Historical Provider, MD  aspirin EC 81 MG tablet Take 81 mg by mouth daily.    Historical Provider, MD  doxycycline (VIBRA-TABS) 100 MG tablet Take 100 mg by mouth 2 (two) times daily. 10/17/14   Historical Provider, MD  FREESTYLE LITE test strip 2 (two) times daily. use for testing 11/19/14   Historical Provider, MD  glipiZIDE (GLUCOTROL XL) 2.5 MG 24 hr tablet Take 2.5 mg by mouth daily with breakfast.    Historical Provider, MD  HYDROcodone-acetaminophen (NORCO) 10-325 MG per tablet Take 1 tablet by mouth every 6 (six) hours as needed. 5/500 mg as needed. lc    Historical Provider, MD    metoprolol tartrate (LOPRESSOR) 25 MG tablet Take 12.5 mg by mouth 2 (two) times daily.    Historical Provider, MD  omeprazole (PRILOSEC) 20 MG capsule Take 20 mg by mouth daily.    Historical Provider, MD    Family History History reviewed. No pertinent family history.  Social History Social History  Substance Use Topics  . Smoking status: Never Smoker  . Smokeless tobacco: Never Used  . Alcohol use No     Allergies   Levofloxacin and Penicillins   Review of Systems Review of Systems  Constitutional: Negative for fever.  Respiratory: Negative for shortness of breath.   Cardiovascular: Negative for chest pain.  Musculoskeletal: Negative for neck pain.       Right knee and right shoulder pain  Skin: Positive for wound.  Neurological: Negative for syncope.  All other systems reviewed and are negative.    Physical Exam Updated Vital Signs BP 186/69 (BP Location: Right Arm)   Pulse 76   Temp 97.7 F (36.5 C) (Oral)   Resp 16   SpO2 97%   Physical Exam  Constitutional: She is oriented to person, place, and time. No distress.  Elderly, no acute distress  HENT:  Large hematoma noted over the left forehead with overlying abrasion, midface stable  Eyes: EOM are normal. Pupils are equal, round, and reactive to light.  Neck:  C-collar in place  Cardiovascular: Normal rate, regular rhythm and normal heart sounds.   Pulmonary/Chest: Effort normal and breath sounds normal. No respiratory distress. She has no wheezes.  Abdominal: Soft. Bowel sounds are normal. There is no tenderness. There is no guarding.  Musculoskeletal:  Limited range of motion of the right knee secondary to pain, no obvious deformities, no effusion, no joint line tenderness, normal range of motion of bilateral hips; however, patient does report pain, limited range of motion of the right shoulder, no obvious deformities or tenderness to palpation, pulses intact  Neurological: She is alert and oriented to  person, place, and time.  Moves all 4 extremities without difficulty, ambulated with assistance to the bathroom  Skin: Skin is warm and dry.  Psychiatric: She has a normal mood and affect.  Nursing note and vitals reviewed.    ED Treatments / Results  Labs (all labs ordered are listed, but only abnormal results are displayed) Labs Reviewed  URINALYSIS, ROUTINE W REFLEX MICROSCOPIC (NOT AT [facility]) - Abnormal; Notable for the following:       Result Value   Protein, ur 30 (*)    Leukocytes, UA SMALL (*)    All other components within normal limits  URINE MICROSCOPIC-ADD ON - Abnormal; Notable for the following:    Squamous Epithelial / LPF 0-5 (*)    Bacteria, UA RARE (*)    All other components within normal limits    EKG  EKG Interpretation  Date/Time:  Friday August 05 2016 02:26:39 EDT Ventricular Rate:  75 PR Interval:    QRS Duration: 77 QT Interval:  384 QTC Calculation: 429 R Axis:   -20 Text Interpretation:  Sinus rhythm Left ventricular hypertrophy Borderline T abnormalities, inferior leads Baseline wander in lead(s) II III aVF Confirmed by Wilkie Aye  MD, Toni Amend ([id number]) on 08/05/2016 2:43:39 AM       Radiology Dg Pelvis 1-2 Views  Result Date: 08/05/2016 CLINICAL DATA:  Status post fall, with pelvic pain. Initial encounter. EXAM: PELVIS - 1-2 VIEW COMPARISON:  CT of the abdomen and pelvis performed 01/16/2016 FINDINGS: There is no evidence of fracture or dislocation. Both femoral heads are seated normally within their respective acetabula. No significant degenerative change is appreciated. Mild sclerotic change is noted at the sacroiliac joints. The visualized bowel gas pattern is grossly unremarkable in appearance. Scattered vascular calcifications are seen. IMPRESSION: No evidence of fracture or dislocation. Electronically Signed   By: Roanna Raider M.D.   On: 08/05/2016 03:13   Dg Shoulder Right  Result Date: 08/05/2016 CLINICAL DATA:  Status post fall. Limited  range of motion and right shoulder pain. EXAM: RIGHT SHOULDER - 2+ VIEW COMPARISON:  None. FINDINGS: There is advanced osteoarthrosis of the right shoulder with near complete loss of the joint space and subchondral sclerosis and geodes formation of the humeral head and glenoid fossa. Deformity of the humeral head is favored to be chronic and possibly the result of prior trauma. There is no displaced humerus fracture identified on the provided views. IMPRESSION: 1. Severe right shoulder osteoarthrosis. 2. Examination limited by patient positioning. Within that limitation, there is no visualized displaced fracture of the humerus. Deformity of the humeral head is favored to be chronic. Electronically Signed   By: Deatra Robinson M.D.   On: 08/05/2016 03:13   Ct Head Wo Contrast  Result Date: 08/05/2016 CLINICAL DATA:  Status post fall. EXAM: CT HEAD WITHOUT CONTRAST CT MAXILLOFACIAL WITHOUT CONTRAST CT CERVICAL SPINE WITHOUT CONTRAST TECHNIQUE:  Multidetector CT imaging of the head, cervical spine, and maxillofacial structures were performed using the standard protocol without intravenous contrast. Multiplanar CT image reconstructions of the cervical spine and maxillofacial structures were also generated. COMPARISON:  Head CT 05/30/2016 . FINDINGS: CT HEAD FINDINGS Brain: There is confluent periventricular hypoattenuation compatible with chronic microvascular disease. No acute intracranial hemorrhage. Evaluation of the posterior fossa is obscured by streak artifact from dental amalgam. Moderate atrophy. No CT evidence of acute cortical infarct. Likely chronic left cerebellar infarct. Vascular: Atherosclerotic calcification of the visualized internal carotid arteries. No hyperdense vessel sign. CT MAXILLOFACIAL FINDINGS Osseous: --Complex facial fracture types: No LeFort, zygomaticomaxillary complex or nasoorbitoethmoidal fracture. --Simple fracture types: None. Orbits: The globes appear intact. Normal appearance of the  intra- and extraconal fat. Symmetric extraocular muscles. Sinuses: No fluid levels or advanced mucosal thickening. Soft tissues: There is a left frontal scalp hematoma. CT CERVICAL SPINE FINDINGS Alignment: Alignment is preserved.  The facets are aligned. Skull base and vertebrae: There is a minimally displaced type 2 fracture of the dens. No other fracture the cervical spine is identified. Soft tissues and spinal canal: Mild prevertebral soft tissue swelling anterior to the C2 vertebral body. No visible canal hematoma. Disc levels: There is severe disc space loss at C5-C7. There is multilevel facet arthrosis. No advanced bony spinal canal stenosis. Upper chest: Nonvisualized Other: IMPRESSION: 1. Minimally displaced type 2 fracture of the dens with associated mild prevertebral soft tissue swelling. 2. No acute intracranial abnormality. 3. Small left frontal scalp hematoma without facial fracture. These results were called by telephone at the time of interpretation on 08/05/2016 at 2:49 am to Dr. Ross Marcus , who verbally acknowledged these results. Electronically Signed   By: Deatra Robinson M.D.   On: 08/05/2016 02:49   Ct Cervical Spine Wo Contrast  Result Date: 08/05/2016 CLINICAL DATA:  Status post fall. EXAM: CT HEAD WITHOUT CONTRAST CT MAXILLOFACIAL WITHOUT CONTRAST CT CERVICAL SPINE WITHOUT CONTRAST TECHNIQUE: Multidetector CT imaging of the head, cervical spine, and maxillofacial structures were performed using the standard protocol without intravenous contrast. Multiplanar CT image reconstructions of the cervical spine and maxillofacial structures were also generated. COMPARISON:  Head CT 05/30/2016 . FINDINGS: CT HEAD FINDINGS Brain: There is confluent periventricular hypoattenuation compatible with chronic microvascular disease. No acute intracranial hemorrhage. Evaluation of the posterior fossa is obscured by streak artifact from dental amalgam. Moderate atrophy. No CT evidence of acute cortical  infarct. Likely chronic left cerebellar infarct. Vascular: Atherosclerotic calcification of the visualized internal carotid arteries. No hyperdense vessel sign. CT MAXILLOFACIAL FINDINGS Osseous: --Complex facial fracture types: No LeFort, zygomaticomaxillary complex or nasoorbitoethmoidal fracture. --Simple fracture types: None. Orbits: The globes appear intact. Normal appearance of the intra- and extraconal fat. Symmetric extraocular muscles. Sinuses: No fluid levels or advanced mucosal thickening. Soft tissues: There is a left frontal scalp hematoma. CT CERVICAL SPINE FINDINGS Alignment: Alignment is preserved.  The facets are aligned. Skull base and vertebrae: There is a minimally displaced type 2 fracture of the dens. No other fracture the cervical spine is identified. Soft tissues and spinal canal: Mild prevertebral soft tissue swelling anterior to the C2 vertebral body. No visible canal hematoma. Disc levels: There is severe disc space loss at C5-C7. There is multilevel facet arthrosis. No advanced bony spinal canal stenosis. Upper chest: Nonvisualized Other: IMPRESSION: 1. Minimally displaced type 2 fracture of the dens with associated mild prevertebral soft tissue swelling. 2. No acute intracranial abnormality. 3. Small left frontal scalp hematoma without facial fracture.  These results were called by telephone at the time of interpretation on 08/05/2016 at 2:49 am to Dr. Ross Marcus , who verbally acknowledged these results. Electronically Signed   By: Deatra Robinson M.D.   On: 08/05/2016 02:49   Dg Knee Complete 4 Views Right  Result Date: 08/05/2016 CLINICAL DATA:  Status post fall, with right knee pain. Initial encounter. EXAM: RIGHT KNEE - COMPLETE 4+ VIEW COMPARISON:  None. FINDINGS: There is no evidence of fracture or dislocation. There is narrowing of the medial and lateral compartments, with diffuse sclerosis and cortical irregularity. Minimal joint space narrowing is noted at the patellofemoral  compartment. Underlying prominent marginal osteophytes are noted about the knee. A fabella is noted. Trace knee joint fluid likely remains within normal limits. Medial soft tissue swelling is noted at knee. IMPRESSION: 1. No evidence of fracture or dislocation. 2. Diffuse osteoarthritis at the right knee. Electronically Signed   By: Roanna Raider M.D.   On: 08/05/2016 03:15   Ct Maxillofacial Wo Contrast  Result Date: 08/05/2016 CLINICAL DATA:  Status post fall. EXAM: CT HEAD WITHOUT CONTRAST CT MAXILLOFACIAL WITHOUT CONTRAST CT CERVICAL SPINE WITHOUT CONTRAST TECHNIQUE: Multidetector CT imaging of the head, cervical spine, and maxillofacial structures were performed using the standard protocol without intravenous contrast. Multiplanar CT image reconstructions of the cervical spine and maxillofacial structures were also generated. COMPARISON:  Head CT 05/30/2016 . FINDINGS: CT HEAD FINDINGS Brain: There is confluent periventricular hypoattenuation compatible with chronic microvascular disease. No acute intracranial hemorrhage. Evaluation of the posterior fossa is obscured by streak artifact from dental amalgam. Moderate atrophy. No CT evidence of acute cortical infarct. Likely chronic left cerebellar infarct. Vascular: Atherosclerotic calcification of the visualized internal carotid arteries. No hyperdense vessel sign. CT MAXILLOFACIAL FINDINGS Osseous: --Complex facial fracture types: No LeFort, zygomaticomaxillary complex or nasoorbitoethmoidal fracture. --Simple fracture types: None. Orbits: The globes appear intact. Normal appearance of the intra- and extraconal fat. Symmetric extraocular muscles. Sinuses: No fluid levels or advanced mucosal thickening. Soft tissues: There is a left frontal scalp hematoma. CT CERVICAL SPINE FINDINGS Alignment: Alignment is preserved.  The facets are aligned. Skull base and vertebrae: There is a minimally displaced type 2 fracture of the dens. No other fracture the cervical  spine is identified. Soft tissues and spinal canal: Mild prevertebral soft tissue swelling anterior to the C2 vertebral body. No visible canal hematoma. Disc levels: There is severe disc space loss at C5-C7. There is multilevel facet arthrosis. No advanced bony spinal canal stenosis. Upper chest: Nonvisualized Other: IMPRESSION: 1. Minimally displaced type 2 fracture of the dens with associated mild prevertebral soft tissue swelling. 2. No acute intracranial abnormality. 3. Small left frontal scalp hematoma without facial fracture. These results were called by telephone at the time of interpretation on 08/05/2016 at 2:49 am to Dr. Ross Marcus , who verbally acknowledged these results. Electronically Signed   By: Deatra Robinson M.D.   On: 08/05/2016 02:49    Procedures Procedures (including critical care time)  Medications Ordered in ED Medications  acetaminophen (TYLENOL) tablet 650 mg (not administered)     Initial Impression / Assessment and Plan / ED Course  I have reviewed the triage vital signs and the nursing notes.  Pertinent labs & imaging results that were available during my care of the patient were reviewed by me and considered in my medical decision making (see chart for details).  Clinical Course  Comment By Time  Discussed C2 fracture with Dr. Yetta Barre.  Recommends collar and 1  mo f/u. Shon Batonourtney F Tannya Gonet, MD 09/08 [id number]    Patient presents following a fall. It was unwitnessed; however, the patient is able to provide history and reports a mechanical fall. She has hematoma to her scalp as well as knee and shoulder pain. She is otherwise nontoxic. ABCs intact. Noted to be hypertensive. Screening EKG does not show any evidence of arrhythmia. No evidence of urinary tract infection. Plain films obtained and reassuring. Patient does have evidence of a C2 fracture on CT scan. Discussed with Dr. Yetta BarreJones. Follow-up in one month. Placed in c-collar. Patient's family was updated at the  bedside.  Final Clinical Impressions(s) / ED Diagnoses   Final diagnoses:  Other closed nondisplaced odontoid fracture, initial encounter (HCC)  Scalp contusion, initial encounter    New Prescriptions New Prescriptions   ACETAMINOPHEN (TYLENOL) 500 MG TABLET    Take 1 tablet (500 mg total) by mouth every 6 (six) hours as needed.     Shon Batonourtney F Gissel Keilman, MD 08/05/16 [phone]

## 2016-08-05 NOTE — ED Notes (Signed)
Family at bedside. Dr. Wilkie AyeHorton at bedside to update family on dx and plan of care.

## 2016-08-05 NOTE — ED Notes (Signed)
[facility] called for transport to facility. 

## 2016-08-05 NOTE — ED Notes (Signed)
Left sided Forehead hematoma noted. Patient reports that she has a Headache

## 2016-08-05 NOTE — ED Notes (Signed)
Patient ambulatory with 2 person assist to the bathroom.

## 2016-08-16 ENCOUNTER — Encounter: Payer: Self-pay | Admitting: Podiatry

## 2016-08-16 ENCOUNTER — Ambulatory Visit (INDEPENDENT_AMBULATORY_CARE_PROVIDER_SITE_OTHER): Payer: Medicare Other | Admitting: Podiatry

## 2016-08-16 DIAGNOSIS — B351 Tinea unguium: Secondary | ICD-10-CM | POA: Diagnosis not present

## 2016-08-16 DIAGNOSIS — E1149 Type 2 diabetes mellitus with other diabetic neurological complication: Secondary | ICD-10-CM

## 2016-08-16 DIAGNOSIS — M79676 Pain in unspecified toe(s): Secondary | ICD-10-CM | POA: Diagnosis not present

## 2016-08-16 DIAGNOSIS — Q828 Other specified congenital malformations of skin: Secondary | ICD-10-CM | POA: Diagnosis not present

## 2016-08-18 NOTE — Progress Notes (Signed)
Patient ID: [name], female   DOB: [birth year], 82 y.o.   MRN: [id number]   Subjective: [name] returns the office today for painful, elongated, thickened toenails which she is unable to trim herself. She also continues to have painful calluses to the balls of the feet with the left side worse than the right. Denies any redness or drainage around the callus/nail sites. Denies any systemic complaints such as fevers, chills, nausea, vomiting.   Objective: AAO 3, NAD DP/PT pulses palpable, CRT less than 3 seconds Protective sensation decreased with Simms Weinstein monofilament Nails hypertrophic, dystrophic, elongated, brittle, discolored 10. There is tenderness overlying the nails 1-5 bilaterally. There is no surrounding erythema or drainage along the nail sites. Small annular hyperkeratotic lesions submetatarsal 2 on the right and a larger lesion is present on the left. No underlying ulceration, drainage or any signs of infection. There does appear to be localized edema around the callus submetatarsal 2 on the left foot. There is no fluctuance or crepitus. There is no open lesion either before after debridement. No open lesions or other pre-ulcerative lesions are identified. 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  There are no open lesions over those areas at this time. No other areas of tenderness bilateral lower extremities. No overlying edema, erythema, increased warmth. No pain with calf compression, swelling, warmth, erythema.  Assessment: Patient presents with symptomatic onychomycosis, porokeratosis  Plan: -Treatment options including alternatives, risks, complications were discussed -Nails sharply debrided 10 without complication/bleeding. -Hyperkeratotic lesion sharply debrided 2 without complication/bleeding. Offloading pads were dispensed. -Discussed daily foot inspection. If there are any  changes, to call the office immediately.  -Follow-up in 3 months or sooner if any problems are to arise. In the meantime, encouraged to call the office with any questions, concerns, changes symptoms.  Ovid CurdMatthew Kurtis Rodgers, DPM

## 2016-09-13 ENCOUNTER — Ambulatory Visit (INDEPENDENT_AMBULATORY_CARE_PROVIDER_SITE_OTHER): Payer: Medicare Other

## 2016-09-13 ENCOUNTER — Other Ambulatory Visit: Payer: Self-pay | Admitting: Neurological Surgery

## 2016-09-13 DIAGNOSIS — M4312 Spondylolisthesis, cervical region: Secondary | ICD-10-CM | POA: Diagnosis not present

## 2016-09-13 DIAGNOSIS — S12112A Nondisplaced Type II dens fracture, initial encounter for closed fracture: Secondary | ICD-10-CM

## 2016-09-13 DIAGNOSIS — M8588 Other specified disorders of bone density and structure, other site: Secondary | ICD-10-CM | POA: Diagnosis not present

## 2016-09-21 ENCOUNTER — Other Ambulatory Visit: Payer: Self-pay | Admitting: Neurological Surgery

## 2016-09-21 DIAGNOSIS — S12112A Nondisplaced Type II dens fracture, initial encounter for closed fracture: Secondary | ICD-10-CM

## 2016-09-29 ENCOUNTER — Other Ambulatory Visit: Payer: Self-pay | Admitting: Neurological Surgery

## 2016-09-29 DIAGNOSIS — S12112A Nondisplaced Type II dens fracture, initial encounter for closed fracture: Secondary | ICD-10-CM

## 2016-10-11 ENCOUNTER — Ambulatory Visit (INDEPENDENT_AMBULATORY_CARE_PROVIDER_SITE_OTHER): Payer: Medicare Other

## 2016-10-11 DIAGNOSIS — S12112D Nondisplaced Type II dens fracture, subsequent encounter for fracture with routine healing: Secondary | ICD-10-CM | POA: Diagnosis not present

## 2016-10-11 DIAGNOSIS — X58XXXD Exposure to other specified factors, subsequent encounter: Secondary | ICD-10-CM

## 2016-10-11 DIAGNOSIS — S12112A Nondisplaced Type II dens fracture, initial encounter for closed fracture: Secondary | ICD-10-CM

## 2016-11-15 ENCOUNTER — Encounter: Payer: Self-pay | Admitting: Podiatry

## 2016-11-15 ENCOUNTER — Ambulatory Visit (INDEPENDENT_AMBULATORY_CARE_PROVIDER_SITE_OTHER): Payer: Medicare Other | Admitting: Podiatry

## 2016-11-15 DIAGNOSIS — E1149 Type 2 diabetes mellitus with other diabetic neurological complication: Secondary | ICD-10-CM

## 2016-11-15 DIAGNOSIS — M79676 Pain in unspecified toe(s): Secondary | ICD-10-CM | POA: Diagnosis not present

## 2016-11-15 DIAGNOSIS — B351 Tinea unguium: Secondary | ICD-10-CM | POA: Diagnosis not present

## 2016-11-15 DIAGNOSIS — Q828 Other specified congenital malformations of skin: Secondary | ICD-10-CM

## 2016-11-15 NOTE — Progress Notes (Signed)
Patient ID: [name], female   DOB: [birth year], 82 y.o.   MRN: [id number]   Subjective: [name] returns the office today for painful, elongated, thickened toenails which she is unable to trim herself. She also continues to have painful calluses to the balls of the feet on the right foot and on the top of her 2nd toe. No redness, swelling, drainage.  Denies any systemic complaints such as fevers, chills, nausea, vomiting.   Objective: AAO 3, NAD- presents in wheelchair with 2 sons present.  DP/PT pulses palpable, CRT less than 3 seconds Protective sensation decreased with Simms Weinstein monofilament Nails hypertrophic, dystrophic, elongated, brittle, discolored 10. There is tenderness overlying the nails 1-5 bilaterally. There is no surrounding erythema or drainage along the nail sites. Small annular hyperkeratotic lesions submetatarsal 2 on the right and to a smaller degree the left. No underlying ulceration, drainage or any signs of infection. Small lesion on the dorsal right 2nd PIPJ.  There is no open lesion either before r oafter debridement. No open lesions or other pre-ulcerative lesions are identified. 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  There are no open lesions over those areas at this time. No other areas of tenderness bilateral lower extremities. No overlying edema, erythema, increased warmth. No pain with calf compression, swelling, warmth, erythema.  Assessment: Patient presents with symptomatic onychomycosis, porokeratosis  Plan: -Treatment options including alternatives, risks, complications were discussed -Nails sharply debrided 10 without complication/bleeding. -Hyperkeratotic lesion sharply debrided 3 without complication/bleeding. Offloading pads were dispensed. -Discussed daily foot inspection. If there are any changes, to call the office immediately.  -Follow-up in 3 months  or sooner if any problems are to arise. In the meantime, encouraged to call the office with any questions, concerns, changes symptoms.  Ovid CurdMatthew Wagoner, DPM

## 2017-06-04 IMAGING — CT CT CERVICAL SPINE W/O CM
3 of 10 series · 9 of 33 positions shown, 10 images · non-contrast
Comparison: Head CT 05/30/2016 .

CLINICAL DATA: Status post fall.

EXAM:
CT HEAD WITHOUT CONTRAST
CT MAXILLOFACIAL WITHOUT CONTRAST
CT CERVICAL SPINE WITHOUT CONTRAST
TECHNIQUE: Multidetector CT imaging of the head, cervical spine, and
maxillofacial structures were performed using the standard protocol
without intravenous contrast. Multiplanar CT image reconstructions
of the cervical spine and maxillofacial structures were also
generated.

[Series 8: max soft · axial · 0.32mm/px · z∈[-212,-126]mm · 4 of 73 slices shown, 5 images]
[im 15/73  soft-tissue]
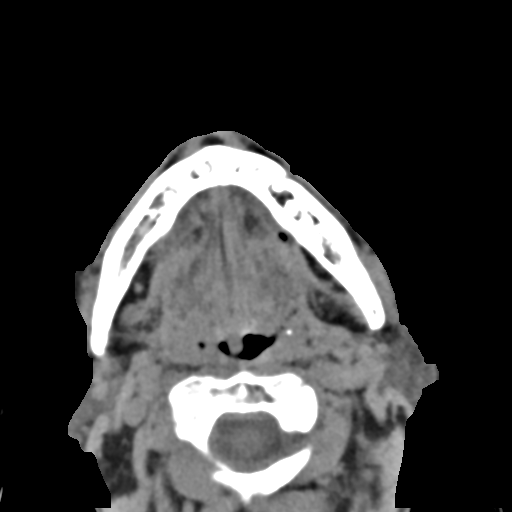
[im 15/73  bone]
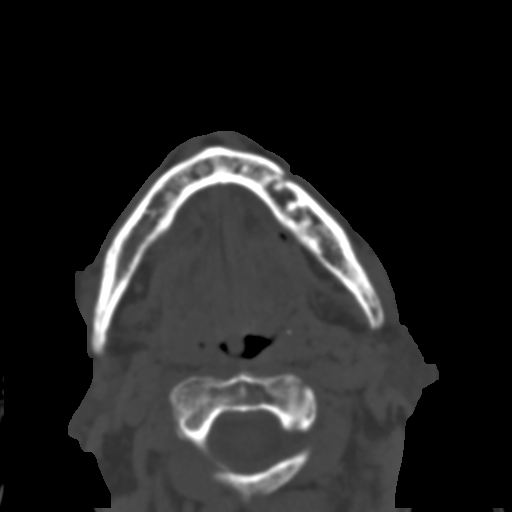
[im 29/73  bone]
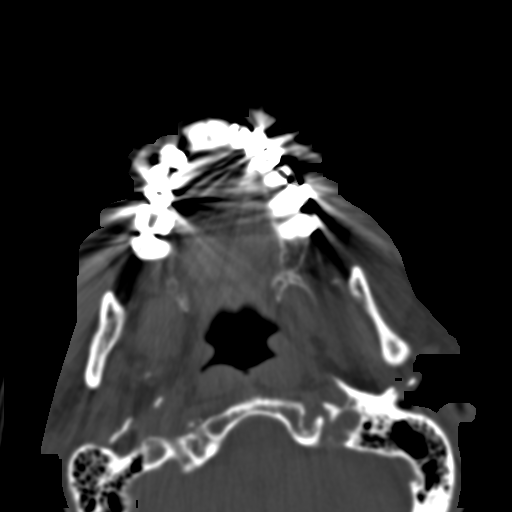
[im 44/73  bone]
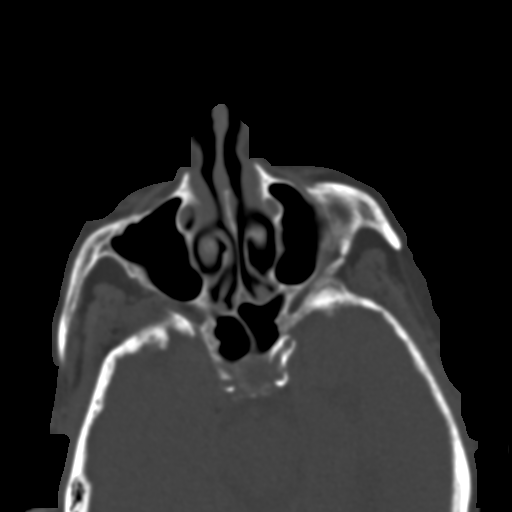
[im 58/73  bone]
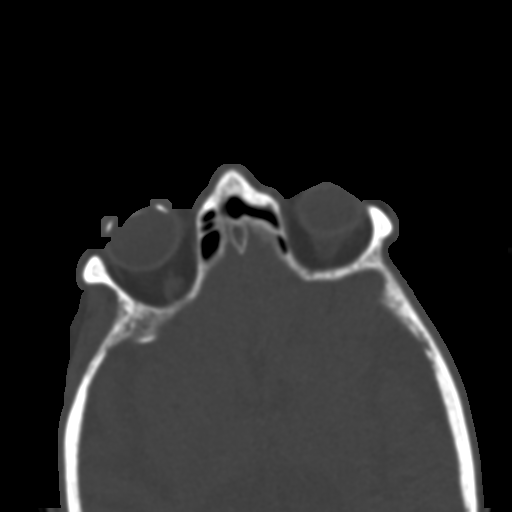

[Series 15: sagittal bone · sagittal · 0.33mm/px · 2 of 70 slices shown]
[im 24/70  bone]
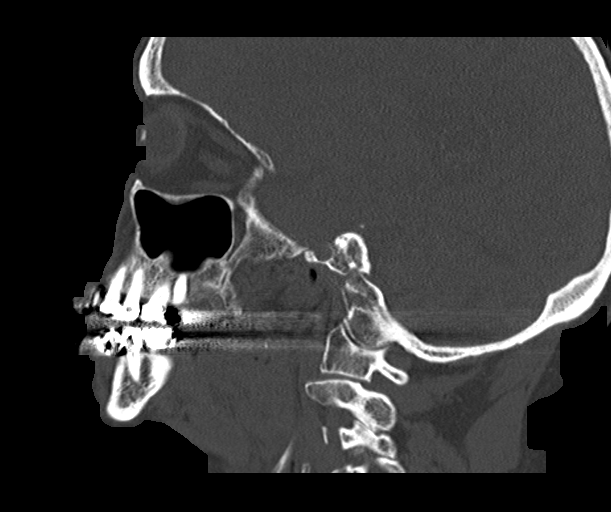
[im 47/70  bone]
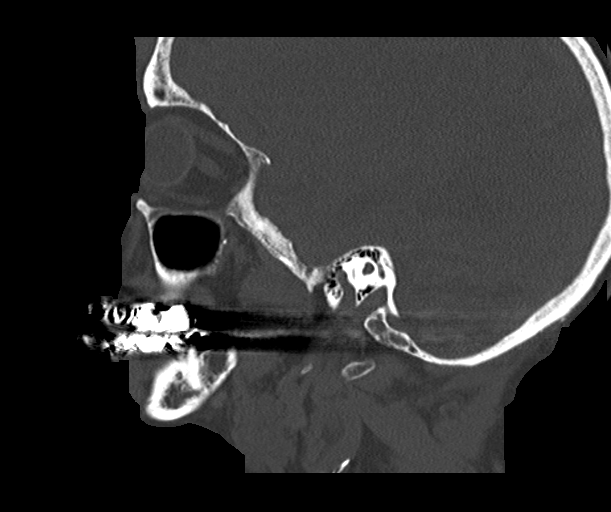

[Series 17: c spine soft · axial · 0.24mm/px · z∈[-260,-196]mm · 3 of 66 slices shown]
[im 17/66  soft-tissue]
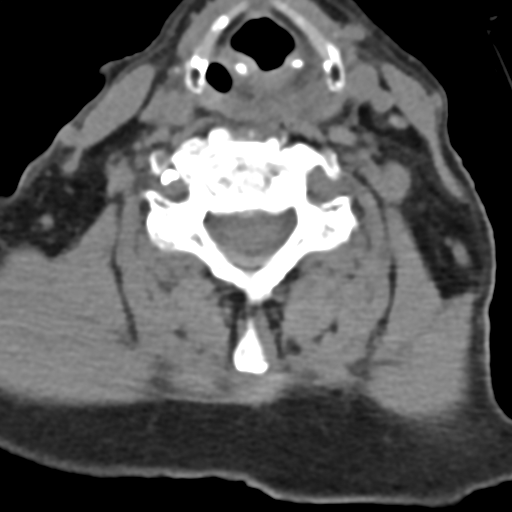
[im 33/66  soft-tissue]
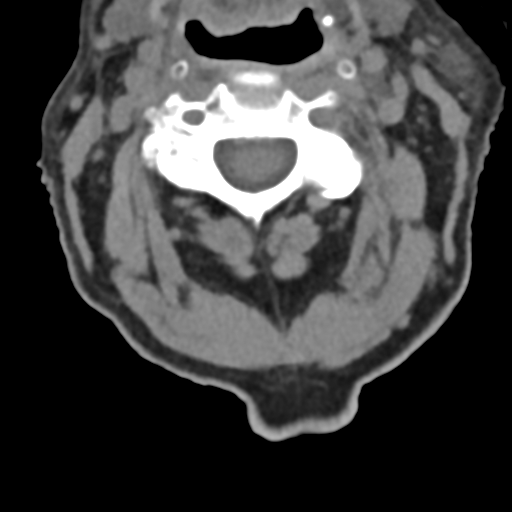
[im 49/66  soft-tissue]
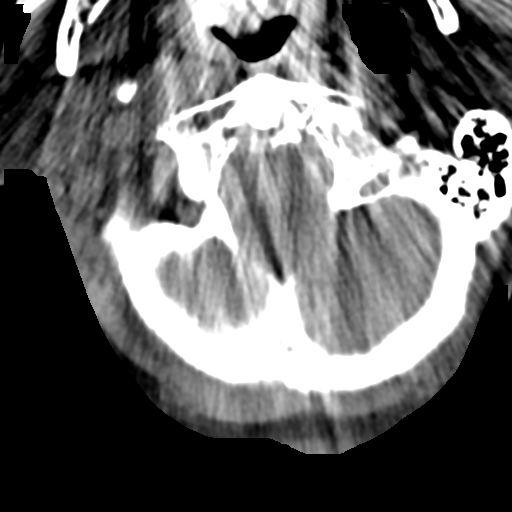

[9 of 33 positions shown; findings below may reference images not displayed]

FINDINGS: CT HEAD FINDINGS

Brain: There is confluent periventricular hypoattenuation compatible
with chronic microvascular disease. No acute intracranial
hemorrhage. Evaluation of the posterior fossa is obscured by streak
artifact from dental amalgam. Moderate atrophy. No CT evidence of
acute cortical infarct. Likely chronic left cerebellar infarct.

Vascular: Atherosclerotic calcification of the visualized internal
carotid arteries. No hyperdense vessel sign.

CT MAXILLOFACIAL FINDINGS

Osseous:

--Complex facial fracture types: No LeFort, zygomaticomaxillary
complex or nasoorbitoethmoidal fracture.

--Simple fracture types: None.

Orbits: The globes appear intact. Normal appearance of the intra-
and extraconal fat. Symmetric extraocular muscles.

Sinuses: No fluid levels or advanced mucosal thickening.

Soft tissues: There is a left frontal scalp hematoma.

CT CERVICAL SPINE FINDINGS

Alignment: Alignment is preserved.  The facets are aligned.

Skull base and vertebrae: There is a minimally displaced type 2
fracture of the dens. No other fracture the cervical spine is
identified.

Soft tissues and spinal canal: Mild prevertebral soft tissue
swelling anterior to the C2 vertebral body. No visible canal
hematoma.

Disc levels: There is severe disc space loss at C5-C7. There is
multilevel facet arthrosis. No advanced bony spinal canal stenosis.

Upper chest: Nonvisualized

Other:
IMPRESSION: 1. Minimally displaced type 2 fracture of the dens with associated
mild prevertebral soft tissue swelling.
2. No acute intracranial abnormality.
3. Small left frontal scalp hematoma without facial fracture.
These results were called by telephone at the time of interpretation
on 08/05/2016 at [DATE] to Dr. TANISHA MAIA , who verbally
acknowledged these results.

## 2017-08-23 ENCOUNTER — Emergency Department (HOSPITAL_BASED_OUTPATIENT_CLINIC_OR_DEPARTMENT_OTHER)
Admission: EM | Admit: 2017-08-23 | Discharge: 2017-08-23 | Disposition: A | Discharge status: Home / Self Care | Payer: Medicare Other | Attending: Emergency Medicine | Admitting: Emergency Medicine

## 2017-08-23 ENCOUNTER — Encounter (HOSPITAL_BASED_OUTPATIENT_CLINIC_OR_DEPARTMENT_OTHER): Payer: Self-pay | Admitting: Emergency Medicine

## 2017-08-23 DIAGNOSIS — I10 Essential (primary) hypertension: Secondary | ICD-10-CM | POA: Insufficient documentation

## 2017-08-23 DIAGNOSIS — F0390 Unspecified dementia without behavioral disturbance: Secondary | ICD-10-CM | POA: Insufficient documentation

## 2017-08-23 DIAGNOSIS — Z7982 Long term (current) use of aspirin: Secondary | ICD-10-CM | POA: Insufficient documentation

## 2017-08-23 DIAGNOSIS — F410 Panic disorder [episodic paroxysmal anxiety] without agoraphobia: Secondary | ICD-10-CM | POA: Diagnosis present

## 2017-08-23 DIAGNOSIS — E119 Type 2 diabetes mellitus without complications: Secondary | ICD-10-CM | POA: Insufficient documentation

## 2017-08-23 DIAGNOSIS — Z7984 Long term (current) use of oral hypoglycemic drugs: Secondary | ICD-10-CM | POA: Insufficient documentation

## 2017-08-23 NOTE — Discharge Instructions (Signed)
Patient stable for return to the [facility]. Patient with no acute distress currently.

## 2017-08-23 NOTE — ED Provider Notes (Signed)
[facility]   CSN: [id number] Arrival date & time: 08/23/17  1128     History   Chief Complaint Chief Complaint  Patient presents with  . Panic Attack    HPI [name] is a 83 y.o. female.  Patient is a resident of [facility]. Patient was having a doctor's visit and got very anxious. Patient was being shipped to [facility] be a ambulance when daughter arrived. Daughter says she has these episodes and that she is very easy to calm down just talking her through it. So she requested that she get evaluated here. Patient's been fine in the ambulance and fine here. Patient currently not on any medication for anxiety but this seems to be an episodic problem. No other concerns per the daughter.       Past Medical History:  Diagnosis Date  . Diabetes mellitus without complication (HCC)   . GERD (gastroesophageal reflux disease)   . Hypertension   . Stroke (HCC)   . Vertigo     There are no active problems to display for this patient.   Past Surgical History:  Procedure Laterality Date  . ABDOMINAL HYSTERECTOMY      OB History    No data available       Home Medications    Prior to Admission medications   Medication Sig Start Date End Date Taking? Authorizing Provider  acetaminophen (TYLENOL) 500 MG tablet Take 1 tablet (500 mg total) by mouth every 6 (six) hours as needed. 08/05/16   Horton, Mayer Masker, MD  amLODipine (NORVASC) 10 MG tablet Take 10 mg by mouth daily.    [provider]  aspirin EC 81 MG tablet Take 81 mg by mouth daily.    [provider]  doxycycline (VIBRA-TABS) 100 MG tablet Take 100 mg by mouth 2 (two) times daily. 10/17/14   [provider]  FREESTYLE LITE test strip 2 (two) times daily. use for testing 11/19/14   [provider]  glipiZIDE (GLUCOTROL XL) 2.5 MG 24 hr tablet Take 2.5 mg by mouth daily with breakfast.    [provider]    HYDROcodone-acetaminophen (NORCO) 10-325 MG per tablet Take 1 tablet by mouth every 6 (six) hours as needed. 5/500 mg as needed. lc    [provider]  metoprolol tartrate (LOPRESSOR) 25 MG tablet Take 12.5 mg by mouth 2 (two) times daily.    [provider]  omeprazole (PRILOSEC) 20 MG capsule Take 20 mg by mouth daily.    [provider]    Family History History reviewed. No pertinent family history.  Social History Social History  Substance Use Topics  . Smoking status: Never Smoker  . Smokeless tobacco: Never Used  . Alcohol use No     Allergies   Levofloxacin and Penicillins   Review of Systems Review of Systems  Unable to perform ROS: Dementia     Physical Exam Updated Vital Signs BP (!) 143/65 (BP Location: Right Arm)   Pulse 89   Temp 98 F (36.7 C) (Oral)   Resp 18   Ht 1.473 m ( )   Wt 56.7 kg (125 lb)   SpO2 99%   BMI 26.13 kg/m   Physical Exam  Constitutional: She appears well-developed and well-nourished. No distress.  HENT:  Head: Normocephalic and atraumatic.  Mouth/Throat: Oropharynx is clear and moist.  Eyes: Pupils are equal, round, and reactive to light. EOM are normal.  Neck: Normal range  of motion.  Cardiovascular: Normal rate, regular rhythm and normal heart sounds.   Pulmonary/Chest: Effort normal and breath sounds normal. No respiratory distress.  Abdominal: Soft. Bowel sounds are normal. There is no tenderness.  Musculoskeletal: Normal range of motion.  Trace edema to lower extremities. Some old bruising to left shoulder.  Neurological: She is alert. No cranial nerve deficit or sensory deficit. She exhibits normal muscle tone. Coordination normal.  Skin: Skin is warm.  Nursing note and vitals reviewed.    ED Treatments / Results  Labs (all labs ordered are listed, but only abnormal results are displayed) Labs Reviewed - No data to display  EKG  EKG Interpretation None       Radiology No  results found.  Procedures Procedures (including critical care time)  Medications Ordered in ED Medications - No data to display   Initial Impression / Assessment and Plan / ED Course  I have reviewed the triage vital signs and the nursing notes.  Pertinent labs & imaging results that were available during my care of the patient were reviewed by me and considered in my medical decision making (see chart for details).     Patient's vital signs here without any significant abnormalities. Patient is calm. Patients daughter with her. Patient daughter states she has these episodic events occur and that is easy to talk her out of the acute anxiety. Patient currently not on medications but it doesn't appear that patient needs to be on medication. Patient stable for discharge back to facility.  Patient is a DO NOT RESUSCITATE.  Final Clinical Impressions(s) / ED Diagnoses   Final diagnoses:  Panic attack    New Prescriptions New Prescriptions   No medications on file     Vanetta Mulders, MD 08/23/17 1200

## 2017-08-23 NOTE — ED Triage Notes (Signed)
Per EMS patient had a panic attack and saw doctor at memory care.  States that she has hx of anxiety but currently does not take any medications.  Reports patient calmed down significantly after daughter arrived on scene.  Alert and oriented at present in NAD.

## 2018-02-24 ENCOUNTER — Emergency Department (HOSPITAL_BASED_OUTPATIENT_CLINIC_OR_DEPARTMENT_OTHER): Payer: Medicare Other

## 2018-02-24 ENCOUNTER — Emergency Department (HOSPITAL_BASED_OUTPATIENT_CLINIC_OR_DEPARTMENT_OTHER)
Admission: EM | Admit: 2018-02-24 | Discharge: 2018-02-24 | Disposition: A | Discharge status: Home / Self Care | Payer: Medicare Other | Attending: Emergency Medicine | Admitting: Emergency Medicine

## 2018-02-24 ENCOUNTER — Other Ambulatory Visit: Payer: Self-pay

## 2018-02-24 ENCOUNTER — Encounter (HOSPITAL_BASED_OUTPATIENT_CLINIC_OR_DEPARTMENT_OTHER): Payer: Self-pay | Admitting: Emergency Medicine

## 2018-02-24 DIAGNOSIS — R042 Hemoptysis: Secondary | ICD-10-CM | POA: Diagnosis present

## 2018-02-24 DIAGNOSIS — Z7984 Long term (current) use of oral hypoglycemic drugs: Secondary | ICD-10-CM | POA: Insufficient documentation

## 2018-02-24 DIAGNOSIS — F0280 Dementia in other diseases classified elsewhere without behavioral disturbance: Secondary | ICD-10-CM | POA: Insufficient documentation

## 2018-02-24 DIAGNOSIS — Z8673 Personal history of transient ischemic attack (TIA), and cerebral infarction without residual deficits: Secondary | ICD-10-CM | POA: Diagnosis not present

## 2018-02-24 DIAGNOSIS — R040 Epistaxis: Secondary | ICD-10-CM | POA: Insufficient documentation

## 2018-02-24 DIAGNOSIS — E119 Type 2 diabetes mellitus without complications: Secondary | ICD-10-CM | POA: Insufficient documentation

## 2018-02-24 DIAGNOSIS — Z7982 Long term (current) use of aspirin: Secondary | ICD-10-CM | POA: Diagnosis not present

## 2018-02-24 DIAGNOSIS — I10 Essential (primary) hypertension: Secondary | ICD-10-CM | POA: Insufficient documentation

## 2018-02-24 DIAGNOSIS — G309 Alzheimer's disease, unspecified: Secondary | ICD-10-CM | POA: Insufficient documentation

## 2018-02-24 DIAGNOSIS — F419 Anxiety disorder, unspecified: Secondary | ICD-10-CM | POA: Diagnosis not present

## 2018-02-24 DIAGNOSIS — Z79899 Other long term (current) drug therapy: Secondary | ICD-10-CM | POA: Insufficient documentation

## 2018-02-24 HISTORY — DX: Alzheimer's disease, unspecified: G30.9

## 2018-02-24 HISTORY — DX: Dementia in other diseases classified elsewhere, unspecified severity, without behavioral disturbance, psychotic disturbance, mood disturbance, and anxiety: F02.80

## 2018-02-24 HISTORY — DX: Anxiety disorder, unspecified: F41.9

## 2018-02-24 MED ORDER — SALINE SPRAY 0.65 % NA SOLN: 2.0000 | Freq: Two times a day (BID) | NASAL | 0 refills | Status: AC | PRN

## 2018-02-24 NOTE — ED Triage Notes (Signed)
Pt lives at [facility]. They found that she had dried blood in her nose. Her son reports she also coughed up bright red blood. Pt appears SOB.

## 2018-02-24 NOTE — ED Notes (Signed)
2L oxygen applied to patient upon arrival to room 12. Pt tachypneic. Has to be reminded to slow her breathing down.

## 2018-02-24 NOTE — ED Notes (Addendum)
Amiable, alert, NAD, calm, interactive, resps e/u, speaking in clear complete sentences, no dyspnea noted, skin W&D, VSS, NSR on monitor HR 95, (denies: complaints, pain, sob, nausea, or dizziness). [facility]

## 2018-02-24 NOTE — ED Provider Notes (Signed)
[facility] EMERGENCY DEPARTMENT Provider Note   CSN: [id number] Arrival date & time: 02/24/18  1754     History   Chief Complaint Chief Complaint  Patient presents with  . Shortness of Breath  . Cough    HPI [name] is a 84 y.o. female.  Complaint is blood in the nose  HPI patient presents for evaluation from her care facility.  They noted that she had some dried blood in her nares.  She coughed, and brought up a little bit of blood.  Family brings her in for evaluation.  Past Medical History:  Diagnosis Date  . Alzheimer's dementia   . Anxiety   . Diabetes mellitus without complication (HCC)   . GERD (gastroesophageal reflux disease)   . Hypertension   . Stroke (HCC)   . Vertigo     There are no active problems to display for this patient.   Past Surgical History:  Procedure Laterality Date  . ABDOMINAL HYSTERECTOMY       OB History   None      Home Medications    Prior to Admission medications   Medication Sig Start Date End Date Taking? Authorizing Provider  acetaminophen (TYLENOL) 500 MG tablet Take 1 tablet (500 mg total) by mouth every 6 (six) hours as needed. 08/05/16   Horton, Mayer Maskerourtney F, MD  amLODipine (NORVASC) 10 MG tablet Take 10 mg by mouth daily.    [provider]  aspirin EC 81 MG tablet Take 81 mg by mouth daily.    [provider]  doxycycline (VIBRA-TABS) 100 MG tablet Take 100 mg by mouth 2 (two) times daily. 10/17/14   [provider]  FREESTYLE LITE test strip 2 (two) times daily. use for testing 11/19/14   [provider]  glipiZIDE (GLUCOTROL XL) 2.5 MG 24 hr tablet Take 2.5 mg by mouth daily with breakfast.    [provider]  HYDROcodone-acetaminophen (NORCO) 10-325 MG per tablet Take 1 tablet by mouth every 6 (six) hours as needed. 5/500 mg as needed. lc    [provider]  metoprolol tartrate (LOPRESSOR) 25 MG tablet Take 12.5 mg by mouth 2 (two) times daily.     [provider]  omeprazole (PRILOSEC) 20 MG capsule Take 20 mg by mouth daily.    [provider]  sodium chloride (OCEAN) 0.65 % SOLN nasal spray Place 2 sprays into both nostrils 2 (two) times daily as needed for congestion. 02/24/18   Rolland PorterJames, Toyna Erisman, MD    Family History No family history on file.  Social History Social History   Tobacco Use  . Smoking status: Never Smoker  . Smokeless tobacco: Never Used  Substance Use Topics  . Alcohol use: No    Alcohol/week: 0.0 oz  . Drug use: No     Allergies   Levofloxacin and Penicillins   Review of Systems Review of Systems  Constitutional: Negative for appetite change, chills, diaphoresis, fatigue and fever.  HENT: Positive for nosebleeds. Negative for mouth sores, sore throat and trouble swallowing.   Eyes: Negative for visual disturbance.  Respiratory: Positive for cough. Negative for chest tightness, shortness of breath and wheezing.   Cardiovascular: Negative for chest pain.  Gastrointestinal: Negative for abdominal distention, abdominal pain, diarrhea, nausea and vomiting.  Endocrine: Negative for polydipsia, polyphagia and polyuria.  Genitourinary: Negative for dysuria, frequency and hematuria.  Musculoskeletal: Negative for gait problem.  Skin: Negative for color change, pallor and rash.  Neurological: Negative for dizziness, syncope,  light-headedness and headaches.  Hematological: Does not bruise/bleed easily.  Psychiatric/Behavioral: Negative for behavioral problems and confusion.     Physical Exam Updated Vital Signs BP (!) 151/77   Pulse 88   Temp 98.1 F (36.7 C) (Oral)   Resp 19   SpO2 91%   Physical Exam  Constitutional: She is oriented to person, place, and time. She appears well-developed and well-nourished. No distress.  HENT:  Head: Normocephalic.  Dry eyes blood in the left naris  Eyes: Pupils are equal, round, and reactive to light. Conjunctivae are normal. No scleral icterus.    Neck: Normal range of motion. Neck supple. No thyromegaly present.  Cardiovascular: Normal rate and regular rhythm. Exam reveals no gallop and no friction rub.  No murmur heard. Pulmonary/Chest: Effort normal and breath sounds normal. No respiratory distress. She has no wheezes. She has no rales.  Patient cough swelling in the room.  Has some scant clear sputum.  No blood  Abdominal: Soft. Bowel sounds are normal. She exhibits no distension. There is no tenderness. There is no rebound.  Musculoskeletal: Normal range of motion.  Neurological: She is alert and oriented to person, place, and time.  Skin: Skin is warm and dry. No rash noted.  Psychiatric: She has a normal mood and affect. Her behavior is normal.     ED Treatments / Results  Labs (all labs ordered are listed, but only abnormal results are displayed) Labs Reviewed - No data to display  EKG None  Radiology Dg Chest 2 View  Result Date: 02/24/2018 CLINICAL DATA:  Shortness of breath, hemoptysis. EXAM: CHEST - 2 VIEW COMPARISON:  Radiographs of December 20, 2017. FINDINGS: Stable cardiomediastinal silhouette. Atherosclerosis of thoracic aorta is noted. No pneumothorax or pleural effusion is noted. Stable interstitial densities are noted throughout both lungs most consistent with scarring. Degenerative changes seen involving the right glenohumeral joint. IMPRESSION: Stable interstitial densities throughout both lungs most consistent with scarring. No acute abnormality seen. Aortic Atherosclerosis (ICD10-I70.0). Electronically Signed   By: Lupita Raider, M.D.   On: 02/24/2018 20:14    Procedures Procedures (including critical care time)  Medications Ordered in ED Medications - No data to display   Initial Impression / Assessment and Plan / ED Course  I have reviewed the triage vital signs and the nursing notes.  Pertinent labs & imaging results that were available during my care of the patient were reviewed by me and  considered in my medical decision making (see chart for details).    X-ray without change.  Humidifier and saline spray.  Final Clinical Impressions(s) / ED Diagnoses   Final diagnoses:  Epistaxis    ED Discharge Orders        Ordered    sodium chloride (OCEAN) 0.65 % SOLN nasal spray  2 times daily PRN     02/24/18 2038       Rolland Porter, MD 02/24/18 2041

## 2018-02-24 NOTE — ED Notes (Signed)
Pt up to 95% on 2L. 82% initially.

## 2018-02-24 NOTE — Discharge Instructions (Addendum)
Use saline spray into each side of your nose am and pm. Use humidifyer in bedroom.

## 2018-07-29 DEATH — deceased

## 2018-12-24 IMAGING — DX DG CHEST 2V
2 series · 2 of 2 positions shown · non-contrast
Comparison: Radiographs December 20, 2017.

CLINICAL DATA: Shortness of breath, hemoptysis.

EXAM:
CHEST - 2 VIEW

[chest lat]
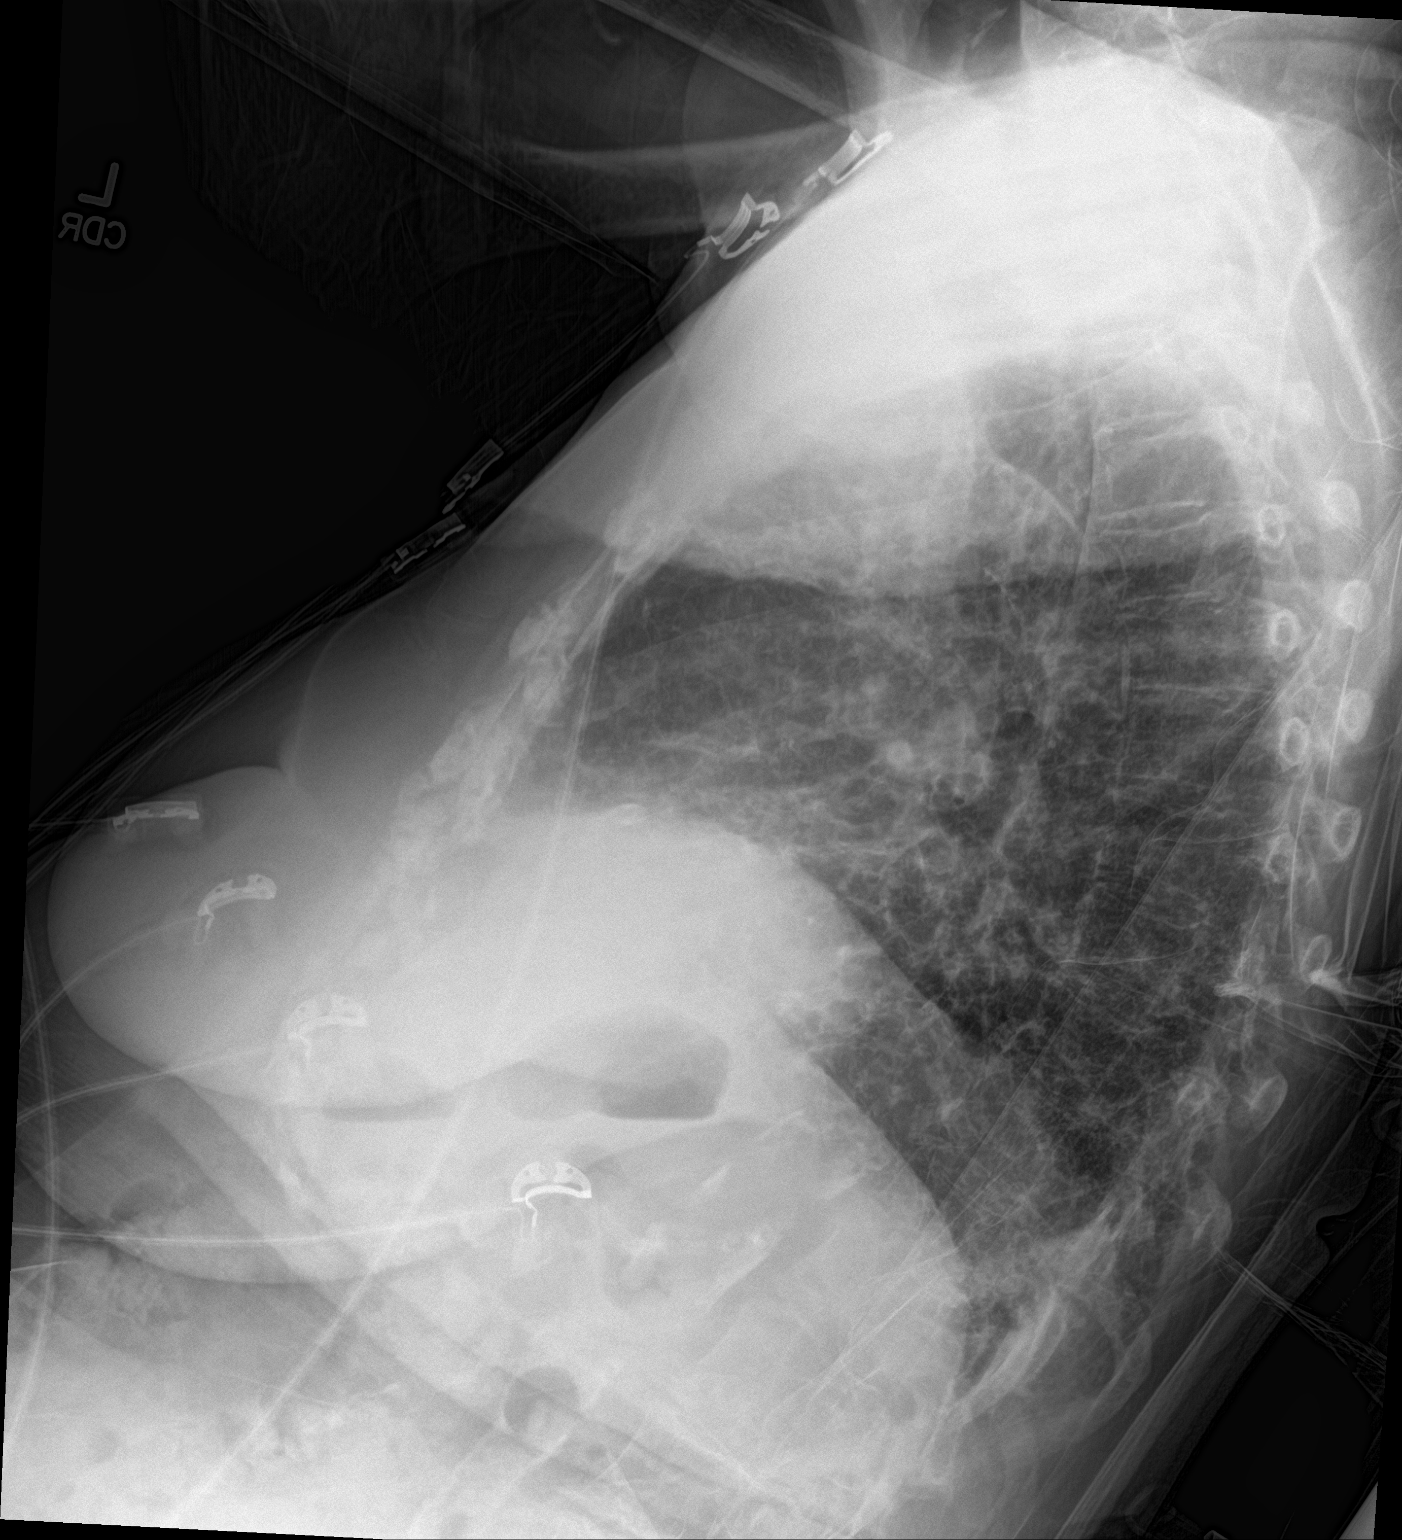

[chest ap strecther]
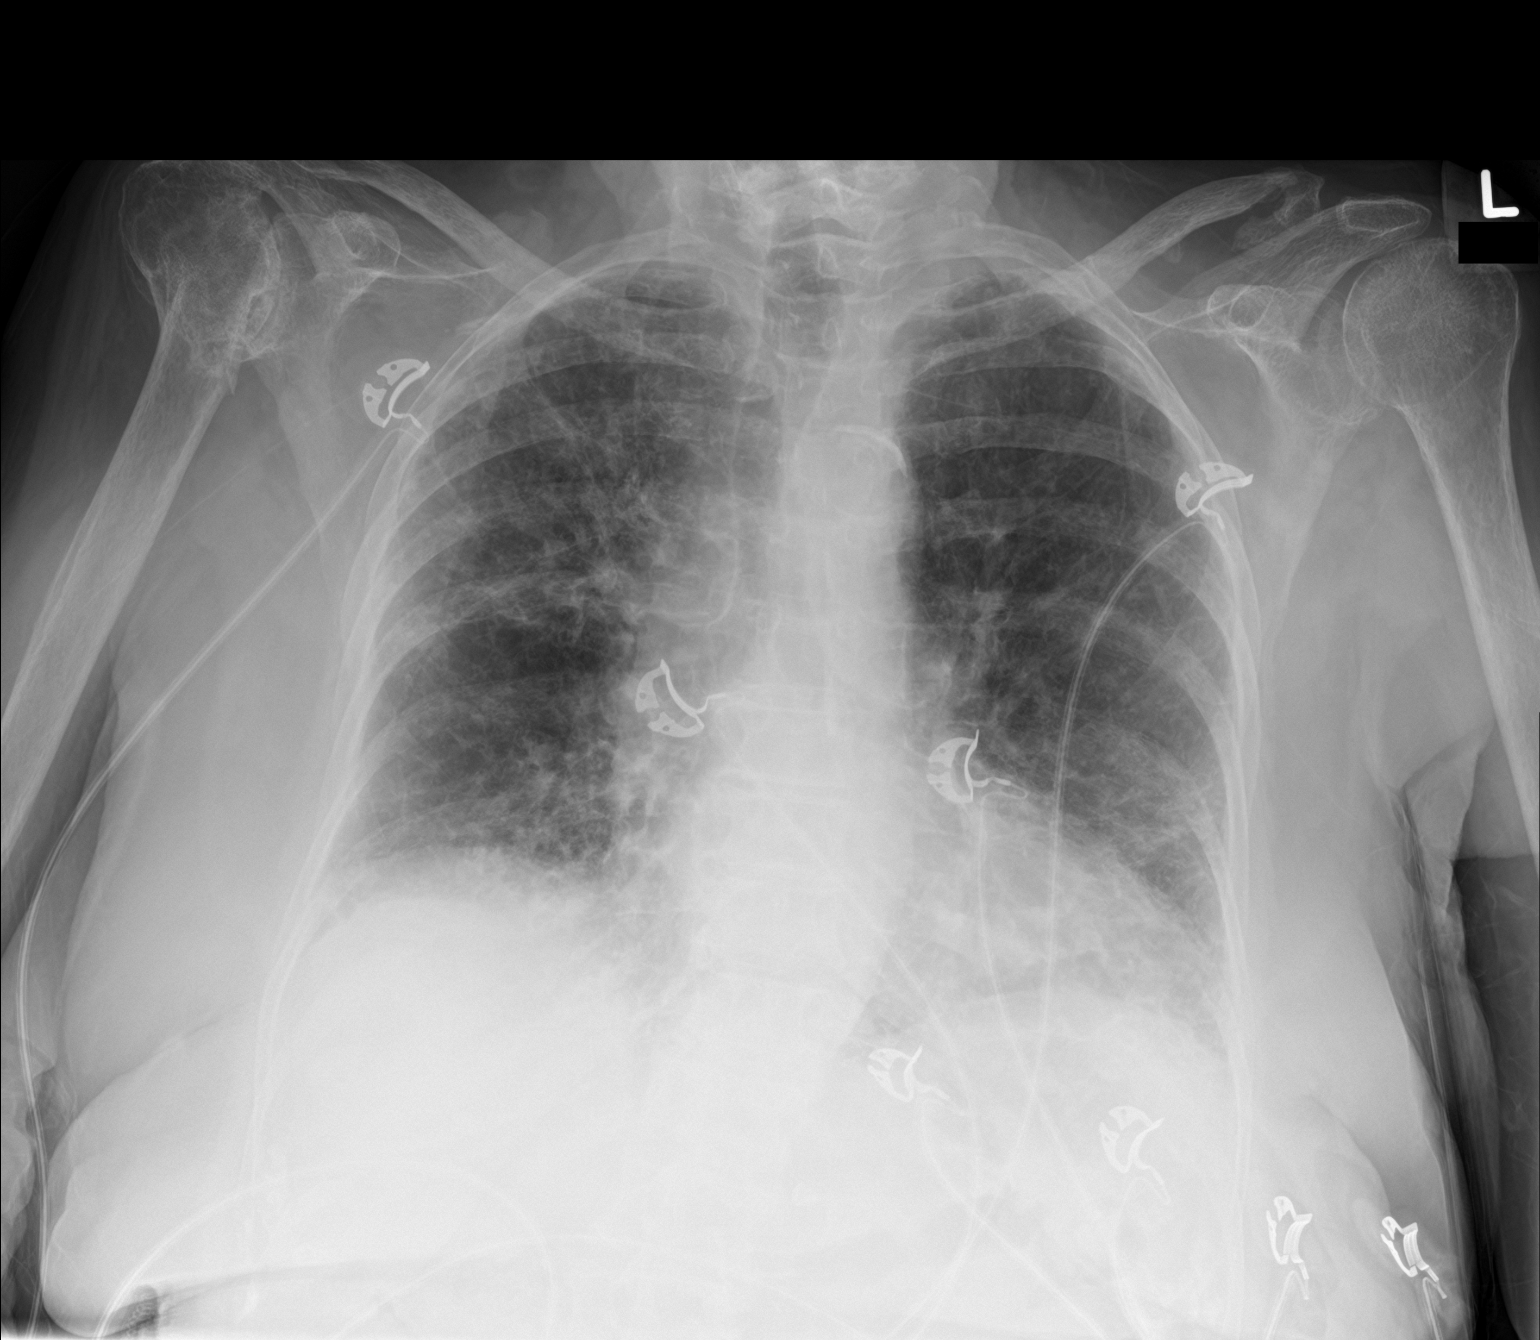

[2 of 2 positions shown; findings below may reference images not displayed]

FINDINGS: Stable cardiomediastinal silhouette. Atherosclerosis of thoracic
aorta is noted. No pneumothorax or pleural effusion is noted. Stable
interstitial densities are noted throughout both lungs most
consistent with scarring. Degenerative changes seen involving the
right glenohumeral joint.
IMPRESSION: Stable interstitial densities throughout both lungs most consistent
with scarring. No acute abnormality seen.

Aortic Atherosclerosis ([id number]).
